# Patient Record
Sex: Female | Born: 1955 | Race: White | Hispanic: No | Marital: Married | Smoking: Never smoker
Health system: Southern US, Community
[De-identification: ages and names within clinical notes are randomized; demographics above are authoritative.]

## PROBLEM LIST (undated history)

## (undated) DIAGNOSIS — I499 Cardiac arrhythmia, unspecified: Secondary | ICD-10-CM

## (undated) DIAGNOSIS — E7800 Pure hypercholesterolemia, unspecified: Secondary | ICD-10-CM

## (undated) DIAGNOSIS — Z923 Personal history of irradiation: Secondary | ICD-10-CM

## (undated) DIAGNOSIS — N3281 Overactive bladder: Secondary | ICD-10-CM

## (undated) DIAGNOSIS — Z87898 Personal history of other specified conditions: Secondary | ICD-10-CM

## (undated) DIAGNOSIS — M1990 Unspecified osteoarthritis, unspecified site: Secondary | ICD-10-CM

## (undated) DIAGNOSIS — L719 Rosacea, unspecified: Secondary | ICD-10-CM

## (undated) DIAGNOSIS — C50919 Malignant neoplasm of unspecified site of unspecified female breast: Secondary | ICD-10-CM

## (undated) DIAGNOSIS — K219 Gastro-esophageal reflux disease without esophagitis: Secondary | ICD-10-CM

## (undated) DIAGNOSIS — K5190 Ulcerative colitis, unspecified, without complications: Secondary | ICD-10-CM

## (undated) DIAGNOSIS — C50912 Malignant neoplasm of unspecified site of left female breast: Secondary | ICD-10-CM

## (undated) DIAGNOSIS — I10 Essential (primary) hypertension: Secondary | ICD-10-CM

## (undated) HISTORY — PX: COLONOSCOPY: SHX174

## (undated) HISTORY — DX: Malignant neoplasm of unspecified site of left female breast: C50.912

## (undated) HISTORY — DX: Overactive bladder: N32.81

## (undated) HISTORY — PX: CRYOABLATION: SHX1415

## (undated) HISTORY — DX: Pure hypercholesterolemia, unspecified: E78.00

---

## 2004-03-27 ENCOUNTER — Ambulatory Visit: Payer: Self-pay | Admitting: Internal Medicine

## 2005-06-06 ENCOUNTER — Ambulatory Visit: Payer: Self-pay | Admitting: Internal Medicine

## 2005-06-19 ENCOUNTER — Ambulatory Visit: Payer: Self-pay | Admitting: Internal Medicine

## 2006-07-17 ENCOUNTER — Ambulatory Visit: Payer: Self-pay | Admitting: Internal Medicine

## 2007-07-18 ENCOUNTER — Ambulatory Visit: Payer: Self-pay | Admitting: Internal Medicine

## 2008-07-05 ENCOUNTER — Ambulatory Visit: Payer: Self-pay | Admitting: Unknown Physician Specialty

## 2008-08-04 ENCOUNTER — Ambulatory Visit: Payer: Self-pay | Admitting: Internal Medicine

## 2008-08-12 ENCOUNTER — Ambulatory Visit: Payer: Self-pay | Admitting: Internal Medicine

## 2009-02-16 ENCOUNTER — Ambulatory Visit: Payer: Self-pay | Admitting: Internal Medicine

## 2009-03-01 ENCOUNTER — Ambulatory Visit: Payer: Self-pay | Admitting: Internal Medicine

## 2010-03-28 ENCOUNTER — Ambulatory Visit: Payer: Self-pay | Admitting: Internal Medicine

## 2011-02-02 ENCOUNTER — Ambulatory Visit: Payer: Self-pay | Admitting: Unknown Physician Specialty

## 2011-03-30 ENCOUNTER — Ambulatory Visit: Payer: Self-pay | Admitting: Internal Medicine

## 2011-07-13 ENCOUNTER — Ambulatory Visit: Payer: Self-pay | Admitting: Internal Medicine

## 2012-03-05 DIAGNOSIS — C50919 Malignant neoplasm of unspecified site of unspecified female breast: Secondary | ICD-10-CM

## 2012-03-05 HISTORY — PX: BREAST LUMPECTOMY: SHX2

## 2012-03-05 HISTORY — PX: OOPHORECTOMY: SHX86

## 2012-03-05 HISTORY — PX: BREAST BIOPSY: SHX20

## 2012-03-05 HISTORY — DX: Malignant neoplasm of unspecified site of unspecified female breast: C50.919

## 2012-04-01 ENCOUNTER — Ambulatory Visit: Payer: Self-pay | Admitting: Internal Medicine

## 2012-04-04 ENCOUNTER — Ambulatory Visit: Payer: Self-pay | Admitting: Internal Medicine

## 2012-04-25 ENCOUNTER — Ambulatory Visit: Payer: Self-pay | Admitting: Surgery

## 2012-05-06 ENCOUNTER — Ambulatory Visit: Payer: Self-pay | Admitting: Surgery

## 2012-05-06 LAB — COMPREHENSIVE METABOLIC PANEL
Albumin: 3.3 g/dL — ABNORMAL LOW (ref 3.4–5.0)
Chloride: 109 mmol/L — ABNORMAL HIGH (ref 98–107)
Co2: 29 mmol/L (ref 21–32)
Creatinine: 0.73 mg/dL (ref 0.60–1.30)
EGFR (African American): >60
Glucose: 81 mg/dL (ref 65–99)
Osmolality: 284 (ref 275–301)
Potassium: 3.6 mmol/L (ref 3.5–5.1)
Sodium: 143 mmol/L (ref 136–145)
Total Protein: 7.1 g/dL (ref 6.4–8.2)

## 2012-05-06 LAB — CBC WITH DIFFERENTIAL/PLATELET
Eosinophil %: 2.2 %
HGB: 14.5 g/dL (ref 12.0–16.0)
Lymphocyte #: 1.5 10*3/uL (ref 1.0–3.6)
Lymphocyte %: 27.4 %
MCHC: 34.1 g/dL (ref 32.0–36.0)
Neutrophil #: 3.6 10*3/uL (ref 1.4–6.5)
RDW: 13.3 % (ref 11.5–14.5)
WBC: 5.5 10*3/uL (ref 3.6–11.0)

## 2012-05-06 LAB — PATHOLOGY REPORT

## 2012-05-12 ENCOUNTER — Ambulatory Visit: Payer: Self-pay | Admitting: Surgery

## 2012-05-13 LAB — PATHOLOGY REPORT

## 2012-05-19 ENCOUNTER — Ambulatory Visit: Payer: Self-pay | Admitting: Oncology

## 2012-06-03 ENCOUNTER — Ambulatory Visit: Payer: Self-pay | Admitting: Oncology

## 2012-06-25 LAB — CBC CANCER CENTER
Basophil %: 0.6 %
Eosinophil #: 0.1 x10 3/mm (ref 0.0–0.7)
HCT: 41.2 % (ref 35.0–47.0)
Lymphocyte #: 1.8 x10 3/mm (ref 1.0–3.6)
MCH: 31.2 pg (ref 26.0–34.0)
MCHC: 34.2 g/dL (ref 32.0–36.0)
MCV: 91 fL (ref 80–100)
Monocyte %: 5.1 %
Neutrophil #: 3.5 x10 3/mm (ref 1.4–6.5)
RDW: 13.3 % (ref 11.5–14.5)
WBC: 5.7 x10 3/mm (ref 3.6–11.0)

## 2012-07-02 LAB — CBC CANCER CENTER
Eosinophil #: 0.1 x10 3/mm (ref 0.0–0.7)
Eosinophil %: 1.7 %
HCT: 39.9 % (ref 35.0–47.0)
HGB: 13.9 g/dL (ref 12.0–16.0)
Lymphocyte #: 1.3 x10 3/mm (ref 1.0–3.6)
MCH: 31.4 pg (ref 26.0–34.0)
MCV: 90 fL (ref 80–100)
Monocyte #: 0.4 x10 3/mm (ref 0.2–0.9)
Neutrophil #: 4.2 x10 3/mm (ref 1.4–6.5)
Neutrophil %: 68.8 %
Platelet: 307 x10 3/mm (ref 150–440)
RBC: 4.43 10*6/uL (ref 3.80–5.20)

## 2012-07-03 ENCOUNTER — Ambulatory Visit: Payer: Self-pay | Admitting: Oncology

## 2012-07-09 LAB — CBC CANCER CENTER
Basophil #: 0 x10 3/mm (ref 0.0–0.1)
Basophil %: 0.7 %
Eosinophil #: 0.1 x10 3/mm (ref 0.0–0.7)
Eosinophil %: 2 %
HGB: 13.7 g/dL (ref 12.0–16.0)
Lymphocyte %: 18.4 %
MCH: 31.2 pg (ref 26.0–34.0)
MCHC: 34.2 g/dL (ref 32.0–36.0)
Monocyte #: 0.4 x10 3/mm (ref 0.2–0.9)
Neutrophil #: 4.6 x10 3/mm (ref 1.4–6.5)
Neutrophil %: 72 %
Platelet: 275 x10 3/mm (ref 150–440)
RBC: 4.38 10*6/uL (ref 3.80–5.20)
RDW: 13.2 % (ref 11.5–14.5)
WBC: 6.4 x10 3/mm (ref 3.6–11.0)

## 2012-07-23 LAB — CBC CANCER CENTER
HGB: 13.8 g/dL (ref 12.0–16.0)
Lymphocyte #: 0.8 x10 3/mm — ABNORMAL LOW (ref 1.0–3.6)
MCH: 31.7 pg (ref 26.0–34.0)
MCHC: 34.5 g/dL (ref 32.0–36.0)
MCV: 92 fL (ref 80–100)
Monocyte #: 0.5 x10 3/mm (ref 0.2–0.9)
Monocyte %: 7.8 %
Neutrophil #: 4.4 x10 3/mm (ref 1.4–6.5)
Neutrophil %: 73.6 %
Platelet: 244 x10 3/mm (ref 150–440)
WBC: 6 x10 3/mm (ref 3.6–11.0)

## 2012-07-30 LAB — CBC CANCER CENTER
Basophil #: 0 x10 3/mm (ref 0.0–0.1)
Eosinophil %: 1.3 %
HGB: 13.8 g/dL (ref 12.0–16.0)
Lymphocyte #: 0.6 x10 3/mm — ABNORMAL LOW (ref 1.0–3.6)
Lymphocyte %: 6.6 %
Monocyte #: 0.4 x10 3/mm (ref 0.2–0.9)
Monocyte %: 4.6 %
Neutrophil #: 7.6 x10 3/mm — ABNORMAL HIGH (ref 1.4–6.5)
Neutrophil %: 87.1 %
RBC: 4.46 10*6/uL (ref 3.80–5.20)
RDW: 13.7 % (ref 11.5–14.5)
WBC: 8.7 x10 3/mm (ref 3.6–11.0)

## 2012-08-03 ENCOUNTER — Ambulatory Visit: Payer: Self-pay | Admitting: Oncology

## 2012-09-02 ENCOUNTER — Ambulatory Visit: Payer: Self-pay | Admitting: Oncology

## 2012-10-03 ENCOUNTER — Ambulatory Visit: Payer: Self-pay | Admitting: Oncology

## 2012-11-28 ENCOUNTER — Ambulatory Visit: Payer: Self-pay | Admitting: Oncology

## 2012-12-03 ENCOUNTER — Ambulatory Visit: Payer: Self-pay | Admitting: Oncology

## 2013-01-05 ENCOUNTER — Ambulatory Visit: Payer: Self-pay | Admitting: Obstetrics and Gynecology

## 2013-01-05 LAB — COMPREHENSIVE METABOLIC PANEL
Albumin: 3.4 g/dL (ref 3.4–5.0)
Alkaline Phosphatase: 87 U/L (ref 50–136)
Anion Gap: 0 — ABNORMAL LOW (ref 7–16)
Co2: 32 mmol/L (ref 21–32)
EGFR (Non-African Amer.): >60
Glucose: 94 mg/dL (ref 65–99)
Osmolality: 279 (ref 275–301)
SGPT (ALT): 34 U/L (ref 12–78)
Sodium: 140 mmol/L (ref 136–145)
Total Protein: 6.7 g/dL (ref 6.4–8.2)

## 2013-01-05 LAB — CBC
HGB: 14 g/dL (ref 12.0–16.0)
MCH: 32 pg (ref 26.0–34.0)
MCHC: 35.4 g/dL (ref 32.0–36.0)
MCV: 90 fL (ref 80–100)
RBC: 4.37 10*6/uL (ref 3.80–5.20)
RDW: 13.1 % (ref 11.5–14.5)
WBC: 5.4 10*3/uL (ref 3.6–11.0)

## 2013-01-16 ENCOUNTER — Ambulatory Visit: Payer: Self-pay | Admitting: Obstetrics and Gynecology

## 2013-02-10 ENCOUNTER — Ambulatory Visit: Payer: Self-pay | Admitting: Oncology

## 2013-02-10 LAB — COMPREHENSIVE METABOLIC PANEL
Alkaline Phosphatase: 75 U/L
Anion Gap: 8 (ref 7–16)
Bilirubin,Total: 0.3 mg/dL (ref 0.2–1.0)
Calcium, Total: 8.8 mg/dL (ref 8.5–10.1)
Chloride: 104 mmol/L (ref 98–107)
Co2: 30 mmol/L (ref 21–32)
EGFR (African American): >60
Osmolality: 284 (ref 275–301)
Potassium: 3.3 mmol/L — ABNORMAL LOW (ref 3.5–5.1)
SGOT(AST): 17 U/L (ref 15–37)
Total Protein: 6.9 g/dL (ref 6.4–8.2)

## 2013-02-10 LAB — CBC CANCER CENTER
Eosinophil #: 0.2 x10 3/mm (ref 0.0–0.7)
Eosinophil %: 3.6 %
HCT: 41.1 % (ref 35.0–47.0)
Lymphocyte #: 1.4 x10 3/mm (ref 1.0–3.6)
MCHC: 33.4 g/dL (ref 32.0–36.0)
Monocyte %: 7.1 %
Neutrophil %: 64.3 %
Platelet: 265 x10 3/mm (ref 150–440)
RBC: 4.47 10*6/uL (ref 3.80–5.20)
RDW: 13.6 % (ref 11.5–14.5)
WBC: 6 x10 3/mm (ref 3.6–11.0)

## 2013-02-11 LAB — CANCER ANTIGEN 27.29: CA 27.29: 35.2 U/mL (ref 0.0–38.6)

## 2013-03-05 ENCOUNTER — Ambulatory Visit: Payer: Self-pay | Admitting: Oncology

## 2013-04-07 ENCOUNTER — Ambulatory Visit: Payer: Self-pay | Admitting: Oncology

## 2013-08-14 ENCOUNTER — Ambulatory Visit: Payer: Self-pay | Admitting: Oncology

## 2013-08-17 LAB — COMPREHENSIVE METABOLIC PANEL
ALK PHOS: 94 U/L
ANION GAP: 6 — AB (ref 7–16)
Albumin: 3.4 g/dL (ref 3.4–5.0)
BILIRUBIN TOTAL: 0.4 mg/dL (ref 0.2–1.0)
BUN: 15 mg/dL (ref 7–18)
CO2: 29 mmol/L (ref 21–32)
CREATININE: 0.59 mg/dL — AB (ref 0.60–1.30)
Calcium, Total: 8.7 mg/dL (ref 8.5–10.1)
Chloride: 107 mmol/L (ref 98–107)
EGFR (African American): >60
EGFR (Non-African Amer.): >60
GLUCOSE: 113 mg/dL — AB (ref 65–99)
OSMOLALITY: 285 (ref 275–301)
POTASSIUM: 3.5 mmol/L (ref 3.5–5.1)
SGOT(AST): 16 U/L (ref 15–37)
SGPT (ALT): 42 U/L (ref 12–78)
Sodium: 142 mmol/L (ref 136–145)
Total Protein: 6.9 g/dL (ref 6.4–8.2)

## 2013-08-17 LAB — CBC CANCER CENTER
BASOS ABS: 0.1 x10 3/mm (ref 0.0–0.1)
BASOS PCT: 1.3 %
EOS ABS: 0.3 x10 3/mm (ref 0.0–0.7)
Eosinophil %: 4.6 %
HCT: 40.8 % (ref 35.0–47.0)
HGB: 13.8 g/dL (ref 12.0–16.0)
LYMPHS ABS: 1.3 x10 3/mm (ref 1.0–3.6)
LYMPHS PCT: 23.5 %
MCH: 31.6 pg (ref 26.0–34.0)
MCHC: 33.8 g/dL (ref 32.0–36.0)
MCV: 93 fL (ref 80–100)
Monocyte #: 0.3 x10 3/mm (ref 0.2–0.9)
Monocyte %: 6 %
NEUTROS ABS: 3.7 x10 3/mm (ref 1.4–6.5)
Neutrophil %: 64.6 %
Platelet: 257 x10 3/mm (ref 150–440)
RBC: 4.37 10*6/uL (ref 3.80–5.20)
RDW: 13.4 % (ref 11.5–14.5)
WBC: 5.7 x10 3/mm (ref 3.6–11.0)

## 2013-09-02 ENCOUNTER — Ambulatory Visit: Payer: Self-pay | Admitting: Oncology

## 2013-11-23 DIAGNOSIS — E785 Hyperlipidemia, unspecified: Secondary | ICD-10-CM | POA: Insufficient documentation

## 2014-02-20 IMAGING — NM NM SENTINAL NODE INJECTION (BREAST) - NO REPORT
2 series · 18 of 18 positions shown · non-contrast
Comparison: none

REASON FOR EXAM: L partial mastectomy SN bx surg 11am   XR NL poss
axillary dissection   2721...
COMMENTS:

PROCEDURE:     NM  - NM SENTINEL NODE  BREAST  - May 12, 2012  [DATE]
RESULT:     Comparison: None.
Radiopharmaceutical:  1.08 mCi Kc-MMm unfiltered sulfur colloid.
TECHNIQUE: After the skin was cleansed in the usual sterile technique, and
local anesthesia was provided with approximately 2 mL 1% Lidocaine, the
radiopharmaceutical was injected into the subcutaneous periareolar tissues
of the left breast. Planar images were obtained in the anterior projection.
The patient's arm was abducted at 90 degrees from the body for the anterior
images.

[Series 1000: sent node breast static · 2.40mm/px · 6 acquisitions, 12 frames shown]
[im 1/6]
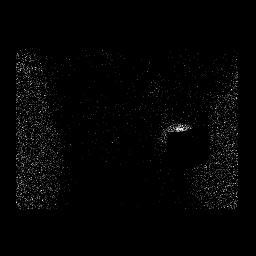
[im 1/6]
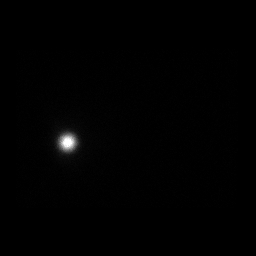
[im 2/6]
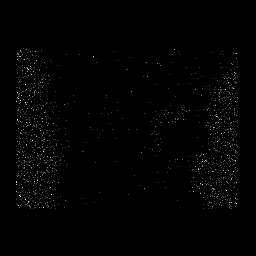
[im 2/6]
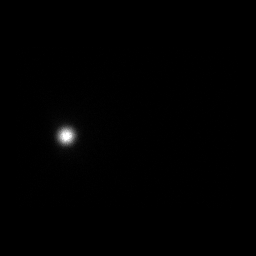
[im 3/6]
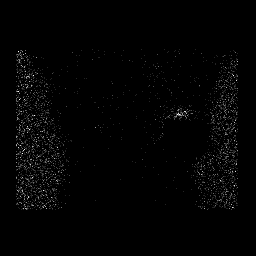
[im 3/6]
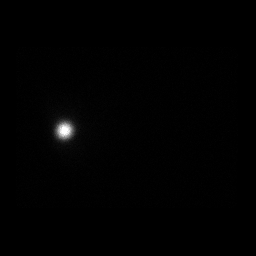
[im 4/6]
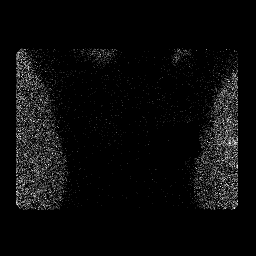
[im 4/6]
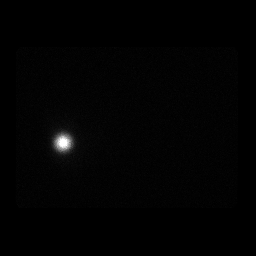
[im 5/6]
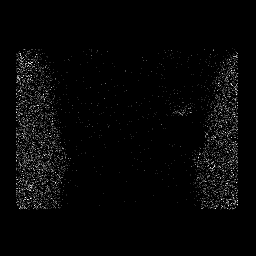
[im 5/6]
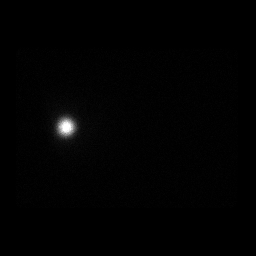
[im 6/6]
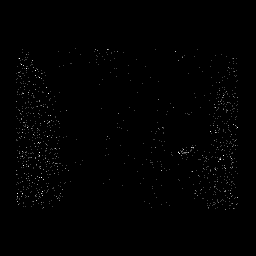
[im 6/6]
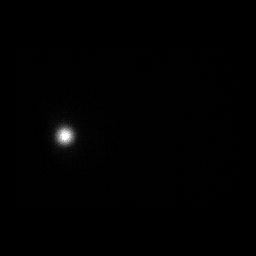

[Series 1000: sent node breast-dynamic · 4.80mm/px · 6 of 52 frames shown]
[frame 5/52  full-range]
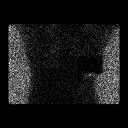
[frame 13/52  full-range]
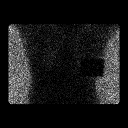
[frame 22/52  full-range]
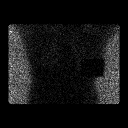
[frame 31/52  full-range]
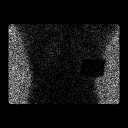
[frame 39/52  full-range]
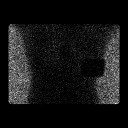
[frame 48/52  full-range]
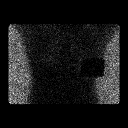

[18 of 18 positions shown; findings below may reference images not displayed]

FINDINGS: Images obtained at 90 minutes post injection demonstrate localization of
radiotracer in the region of the left periareolar soft tissues..
IMPRESSION: Successful injection of radiotracer into the left breast
periareolar soft tissues for intraoperative localization of sentinel lymph
nodes with a gamma probe.

## 2014-02-22 ENCOUNTER — Ambulatory Visit: Payer: Self-pay | Admitting: Oncology

## 2014-02-22 LAB — COMPREHENSIVE METABOLIC PANEL
ALBUMIN: 3.5 g/dL (ref 3.4–5.0)
Alkaline Phosphatase: 111 U/L
Anion Gap: 7 (ref 7–16)
BUN: 17 mg/dL (ref 7–18)
Bilirubin,Total: 0.4 mg/dL (ref 0.2–1.0)
CREATININE: 0.76 mg/dL (ref 0.60–1.30)
Calcium, Total: 8.5 mg/dL (ref 8.5–10.1)
Chloride: 106 mmol/L (ref 98–107)
Co2: 31 mmol/L (ref 21–32)
EGFR (African American): >60
EGFR (Non-African Amer.): >60
Glucose: 80 mg/dL (ref 65–99)
Osmolality: 287 (ref 275–301)
Potassium: 3.6 mmol/L (ref 3.5–5.1)
SGOT(AST): 16 U/L (ref 15–37)
SGPT (ALT): 37 U/L
SODIUM: 144 mmol/L (ref 136–145)
TOTAL PROTEIN: 7 g/dL (ref 6.4–8.2)

## 2014-02-22 LAB — CBC CANCER CENTER
BASOS ABS: 0.1 x10 3/mm (ref 0.0–0.1)
Basophil %: 0.9 %
EOS PCT: 2.4 %
Eosinophil #: 0.1 x10 3/mm (ref 0.0–0.7)
HCT: 42.7 % (ref 35.0–47.0)
HGB: 14.3 g/dL (ref 12.0–16.0)
Lymphocyte #: 1.3 x10 3/mm (ref 1.0–3.6)
Lymphocyte %: 20.9 %
MCH: 30.9 pg (ref 26.0–34.0)
MCHC: 33.5 g/dL (ref 32.0–36.0)
MCV: 92 fL (ref 80–100)
Monocyte #: 0.5 x10 3/mm (ref 0.2–0.9)
Monocyte %: 7.3 %
Neutrophil #: 4.3 x10 3/mm (ref 1.4–6.5)
Neutrophil %: 68.5 %
Platelet: 280 x10 3/mm (ref 150–440)
RBC: 4.63 10*6/uL (ref 3.80–5.20)
RDW: 13.3 % (ref 11.5–14.5)
WBC: 6.3 x10 3/mm (ref 3.6–11.0)

## 2014-03-05 ENCOUNTER — Ambulatory Visit: Payer: Self-pay | Admitting: Oncology

## 2014-04-08 ENCOUNTER — Ambulatory Visit: Payer: Self-pay | Admitting: Oncology

## 2014-04-29 ENCOUNTER — Ambulatory Visit: Payer: Self-pay | Admitting: Oncology

## 2014-06-25 NOTE — Op Note (Signed)
PATIENT NAME:  Debbie Floyd MR#:  [id number] DATE OF BIRTH:  1956-01-30  DATE OF PROCEDURE:  05/12/2012  PREOPERATIVE DIAGNOSIS: Carcinoma of the left breast.   POSTOPERATIVE DIAGNOSIS: Carcinoma of the left breast.   PROCEDURE: Left partial mastectomy with axillary sentinel lymph node biopsy.   SURGEON: Rochel Brome, M.D.   ANESTHESIA: General.   INDICATIONS: This 59 year old female recently had a mammogram depicting a mass in the upper outer quadrant of the left breast. She had needle biopsy demonstrating infiltrating mammary carcinoma. She had preoperative x-ray needle localization and sentinel node scan.   DESCRIPTION OF PROCEDURE:  The patient was placed on the operating table in the supine position under general anesthesia. The dressing was removed from the left breast, exposing the Kopans wire which entered the breast at approximately the 3 o'clock position. The wire was cut 2 cm from the skin. The left arm was placed on a lateral arm support. The left breast, axilla and surrounding areas were prepared with ChloraPrep and draped in a sterile manner.   The sentinel lymph node biopsy was done first. The axilla was scanned with the gamma counter, demonstrating location of radioactivity in the inferior aspect of the left axilla. An oblique incision was made from the border of the pectoralis major muscle posteriorly some 4 cm in length, carried down through subcutaneous tissues. Several small bleeding points were cauterized. Dissection was carried down through the superficial fascia using the gamma counter for direction and dissected down deeply within the axilla adjacent to the rib cage to encounter a lymph node with radioactivity. This lymph node was somewhat large in size but soft and was dissected free from surrounding structures and did remove a small amount of fatty material with it. One bleeding point was suture ligated with 4-0 chromic. The lymph node was excised. It was approximately a  centimeter in dimension. The ex vivo count was in the range of 190 to 220  counts per second. This was sent for frozen section. The background count was less than 11. There was no remaining palpable mass within the axilla. Hemostasis appeared to be intact.   Attention was turned to do the left partial mastectomy.  A curvilinear incision was made from the 2 o'clock to the 4 o'clock position in the outer aspect left breast and did remove an ellipse of skin which was approximately 1 cm in width and dissected down laterally to encounter the wire and dissected medially and dissected around the wire, and began to feel some firmness surrounding the innermost portion of the wire. The tissues were dissected free from surrounding structures and dissected deeply within the breast and removed the specimen. The 4 o'clock position of the skin ellipse was tagged with a stitch. Margin maps were attached to the specimen to mark the medial, lateral, cranial, caudal and deep margins and was submitted for pathology. Both wounds were inspected and hemostasis appeared to be intact. Both wounds were infiltrated with 0.5% Sensorcaine with epinephrine.   The pathologist called back 2 times to first report the sentinel lymph node appeared to be free of tumor and second time to indicate that the surgical margins appeared to be free of tumor. The axillary wound was closed with a running 4-0 Monocryl subcuticular suture. The breast wound was closed with 4-0 chromic interrupted subcutaneous sutures and then the skin was closed with running 4-0 Monocryl subcuticular suture. Both wounds were then treated with Dermabond and allowed to dry. The patient appeared to tolerate procedure satisfactorily  and was then prepared for transfer to the recovery room.    ____________________________ Lenna Sciara. Rochel Brome, MD jws:cs D: 05/12/2012 13:13:34 ET T: 05/12/2012 14:23:37 ET JOB#: [id number]  cc: Loreli Dollar, MD, <Dictator> Loreli Dollar  MD ELECTRONICALLY SIGNED 05/13/2012 19:05

## 2014-06-25 NOTE — Consult Note (Signed)
Reason for Visit: This 59 year old Female patient presents to the clinic for initial evaluation of  breast cancer .   Referred by Dr. Oliva Bustard.  Diagnosis:  Chief Complaint/Diagnosis   59 year old female with pathologic stage Ib (T1 B. N0 M0) invasive mammary carcinoma status post wide local excision and sentinel node biopsy ER/PR positive HER-2/neu negative by fish  Pathology Report pathology report reviewed   Imaging Report mammograms ultrasound reviewed   Referral Report clinical notes reviewed   Planned Treatment Regimen adjuvant whole breast radiation   HPI   patient is a10 year old femalewho presented with an abnormal mammogram of her left breast. there was a nodular density noted in the 2:00 region anteriorly concerning for malignancy. This was confirmed on ultrasound.she underwent biopsy positive for invasive mammary carcinoma. She then underwent a wide local excision and sentinel node biopsy for a 0.9 cm invasive mammary carcinoma. Margins were clear at 3 mm. There was ductal carcinomas also involved margins clear for that also. Sentinel lymph node was negative for metastatic disease. Tumor was overall grade 2. Tumor was ER/PR positive HER-2/neu not overexpressed.patients been seen by medical oncology and Oncotype DX has been ordered. She seen today for opinion regarding radiation therapy. She is doing well. She specifically denies breast tenderness cough or bone pain. She did have some bruising around this the site although that is responding well.  Past Hx:    Breast Cancer, Left:    Ulcerative Colitis:    Lumpectomy - left:   Past, Family and Social History:  Past Medical History positive   Gastrointestinal ulcerative colitis   Family History positive   Family History Comments maternal grandmother with ovarian cancer, father side effects or has significant breast cancer history. Also history of lymphoma and prostate cancer   Social History noncontributory    Additional Past Medical and Surgical History accompanied by husband today   Allergies:   Sulfa: Fever, Rash  Penicillin: Rash  Home Meds:  Home Medications: Medication Instructions Status  multivitamin 1 tab(s) orally once a day (in the evening) Active  Delzicol 400 mg oral delayed release capsule 1 cap(s) orally 2 times a day Active  doxycycline hyclate 100 mg oral capsule 1 cap(s) orally every other day Active  omeprazole 20 mg oral delayed release capsule 1 cap(s) orally once a day (in the morning) Active  oxybutynin 5 mg/24 hours oral tablet, extended release 1 tab(s) orally once a day (at bedtime) Active  Metoprolol Tartrate 25 mg oral tablet 1 tab(s) orally once a day (at bedtime) Active  ibuprofen 200 mg oral capsule 2 cap(s) orally once a day, As Needed. Stopped 3 weeks ago Active   Review of Systems:  General negative   Performance Status (ECOG) 0   Skin negative   Breast see HPI   Ophthalmologic negative   ENMT negative   Respiratory and Thorax negative   Cardiovascular negative   Gastrointestinal negative   Genitourinary negative   Musculoskeletal negative   Neurological negative   Psychiatric negative   Hematology/Lymphatics negative   Endocrine negative   Allergic/Immunologic negative   Review of Systems   according to the nurse's notesPatient denies any weight loss, fatigue, weakness, fever, chills or night sweats. Patient denies any loss of vision, blurred vision. Patient denies any ringing  of the ears or hearing loss. No irregular heartbeat. Patient denies heart murmur or history of fainting. Patient denies any chest pain or pain radiating to her upper extremities. Patient denies any shortness of breath, difficulty  breathing at night, cough or hemoptysis. Patient denies any swelling in the lower legs. Patient denies any nausea vomiting, vomiting of blood, or coffee ground material in the vomitus. Patient denies any stomach pain. Patient states has  had normal bowel movements no significant constipation or diarrhea. Patient denies any dysuria, hematuria or significant nocturia. Patient denies any problems walking, swelling in the joints or loss of balance. Patient denies any skin changes, loss of hair or loss of weight. Patient denies any excessive worrying or anxiety or significant depression. Patient denies any problems with insomnia. Patient denies excessive thirst, polyuria, polydipsia. Patient denies any swollen glands, patient denies easy bruising or easy bleeding. Patient denies any recent infections, allergies or URI. Patient "s visual fields have not changed significantly in recent time.  Nursing Notes:  Nursing Vital Signs and Chemo Nursing Nursing Notes: *CC Vital Signs Flowsheet:   20-Mar-14 14:15  Temp Temperature 98.9  Pulse Pulse 84  Respirations Respirations 20  SBP SBP 152  DBP DBP 80  Pain Scale (0-10)  0  Current Weight (kg) (kg) 76.3  Height (cm) centimeters 165  BSA (m2) 1.8   Physical Exam:  General/Skin/HEENT:  General normal   Skin normal   Eyes normal   ENMT normal   Head and Neck normal   Additional PE well-developed well-nourished female in NAD. Lungs are clear to A&P cardiac examination shows regular rate and rhythm. Left breast shows a wide local excision scar which is healing well with some still persistent ecchymosis surrounding the scar site. No dominant mass or nodularity in either breast into position examined. No axillary or supraclavicular adenopathy is appreciated.   Breasts/Resp/CV/GI/GU:  Respiratory and Thorax normal   Cardiovascular normal   Gastrointestinal normal   Genitourinary normal   MS/Neuro/Psych/Lymph:  Musculoskeletal normal   Neurological normal   Lymphatics normal   Other Results:  Radiology Results: [place]:    31-Jan-14 09:30, US Breast Left  US Breast Left   REASON FOR EXAM:    av lt parenchymal density  COMMENTS:       PROCEDURE: [place]  - US BREAST LEFT  -  Apr 04 2012  9:30AM     RESULT: Targeted ultrasound left breast 2:00 region demonstrates an   irregularly marginated area of hypoechogenicity with shadowing concerning   for malignancy. This measures 8.5 x 6.1 x 7.3 mm. A this is 2 cm from the   nipple. There is evidence of internal blood flow.    IMPRESSION:  Findings concerning for underlying malignancy in the left   breast 2:00 region 3 cm from the nipple. Surgical consultation for   lumpectomy is suggested. A BI-RADS category 4    Dictation Site: 1    Verified By: Sundra Aland, M.D., MD  LabUnknown:    31-Jan-14 08:52, Digital Additional Views Lt Breast [facility])  PACS Image     31-Jan-14 09:30, US Breast Left  PACS [place]:    31-Jan-14 08:52, Digital Additional Views Lt Breast (SCR)  Digital Additional Views Lt Breast (SCR)   REASON FOR EXAM:    av lt parenchymal density  COMMENTS:       PROCEDURE: MAM - MAM DGTL ADD VW LT  SCR  - Apr 04 2012  8:52AM     RESULT: The patient return for additional magnification compression   images of the left breast area of irregular parenchymal density which is   increased in size compared to previous exams that had been previously  stable. The area persists on the additional magnification compression   images. Ultrasound is dictated separately. The margins appear to be   irregular. There is some architectural distortion. The lesion is   concerning for left breast cancer in approximately the 2:00 position.    IMPRESSION:  And nodular area in the left breast 2:00 region anteriorly   concerning for underlying malignancy. Surgical consultation with biopsy     is recommended.    BI-RADS: Category 4 - Suspicious Abnormality - Biopsy Should Be Considered      Thank you for the oppurtunity to contribute to the care of your patient.     Dictation Site: 1        Verified By: Sundra Aland, M.D., MD   Assessment and Plan: Impression:   stage IB invasive mammary  carcinoma left breast teslas wide local excision in 59 year old female tumor is ER/PR positive HER-2/neu not overexpressed patient is pending Oncotype DX testing Plan:   patient has excellent prognostic factors although we will wait Oncotype DX result before proceeding with radiation therapy. Should she be a high-risk of recurrence will wait for radiation therapy to left chemotherapy is completed. Otherwise we'll go ahead with whole breast radiation therapy to 5000 cGy and boost or scar another 1600 cGy using electron beam. Risks and benefits of treatment including skin reaction, fatigue, inclusion of some superficial lung, and alteration blood counts were all explained in detail to the patient and her husband. They both seem to comprehend my treatment plan well. I have set her up for CT simulation about 2 weeks we'll not proceed until I have Oncotype DX result and have discussed them personally with Dr. Oliva Bustard.  I would like to take this opportunity to thank you for allowing me to continue to participate in this patient's care.  CC Referral:  cc: Dr. Tamala Julian, Dr. Apolonio Schneiders   Electronic Signatures: Baruch Gouty, Roda Shutters (MD)  (Signed 20-Mar-14 15:16)  Authored: HPI, Diagnosis, Past Hx, PFSH, Allergies, Home Meds, ROS, Nursing Notes, Physical Exam, Other Results, Encounter Assessment and Plan, CC Referring Physician   Last Updated: 20-Mar-14 15:16 by Armstead Peaks (MD)

## 2014-06-25 NOTE — Op Note (Signed)
PATIENT NAME:  Debbie Floyd, Debbie Floyd MR#:  [id number] DATE OF BIRTH:  [birth year]  DATE OF PROCEDURE:  01/16/2013  PREOPERATIVE DIAGNOSIS: Estrogen receptor positive breast cancer.   POSTOPERATIVE DIAGNOSIS: Estrogen receptor positive breast cancer.   PROCEDURES:  1. Operative laparoscopy.  2. Bilateral salpingo-oophorectomy.   ANESTHESIA: General.   SURGEON: Will Bonnet, M.D.   ASSISTANT SURGEON: Annia Belt, P.A.S. (student).   ESTIMATED BLOOD LOSS: 10 mL.   OPERATIVE FLUIDS: 1100 mL of crystalloid.   COMPLICATIONS: None.   FINDINGS: Normal-appearing uterus, fallopian tubes and ovaries.   SPECIMENS:  1. Right fallopian tube and ovary.  2. Left fallopian tube and ovary.   CONDITION AT END OF THE PROCEDURE: Stable.   PROCEDURE IN DETAIL: The patient was taken to the operating room where general anesthesia was administered and found to be adequate. She was placed in the dorsal supine position and prepped and draped in the usual sterile fashion. After a timeout was called and injection of local anesthetic, a 10 mm infraumbilical skin incision was made with a scalpel. The abdomen was entered using direct visualization with an Optiview trocar technique. Abdominal entry was verified using opening pressure. The abdomen was insufflated with CO2. The camera was introduced through the operative port, and atraumatic entry was verified. The patient was placed in Trendelenburg, and a left lower quadrant 5 mm port was placed under direct intra-abdominal camera visualization after injection of local anesthetic. A right lower quadrant 5 mm port was placed in the same fashion without difficulty. Attention was turned to the right pelvic brim where the right fallopian tube and ovary were noted and separate from that where it was noted the ureter was well away from the operative area of interest. The right fallopian tube at the fimbriated end was grasped with an atraumatic grasper, and the IP ligament  was cauterized using the LigaSure device and then transected with the LigaSure device as well. The entire fallopian tube and ovary were then removed after transection of the broad ligament as well as the utero-ovarian ligament. Hemostasis was obtained, and the specimen was removed through the umbilical port using an Endo Catch bag.   Attention was turned to the left fallopian tube and ovary, and in a similar fashion, the ureter was identified to be well out of the way of the operative area of interest. Using the LigaSure device, the IP ligament was cauterized with electrocautery and transected, and the left fallopian tube and ovary were removed in the usual fashion. Hemostasis was noted at the entire pedicle for both right and left fallopian tubes and ovaries.   This concluded this procedure. The abdomen was desufflated, and all port sites were removed without difficulty. The 10 mm infraumbilical port site was closed subcutaneously using 3-0 Vicryl, and the skin was approximated at each port site using Dermabond. A total of 20 mL was injected for local anesthetic at all sites. The anesthetic was 0.5% bupivacaine.   The patient tolerated the procedure well. Sponge, lap and needle counts were correct x 2. The patient did have an indwelling catheter in place throughout the entire case but was removed at the end of the procedure. She was using pneumatic compression stockings for VTE prophylaxis throughout the entire case. She was awakened in the operating room and taken to the recovery area in stable condition.    ____________________________ Will Bonnet, MD sdj:gb D: 01/16/2013 16:34:02 ET T: 01/16/2013 22:34:22 ET JOB#: [id number]  cc: Will Bonnet, MD, <Dictator> Jaivion Kingsley D  Glennon Mac MD ELECTRONICALLY SIGNED 01/18/2013 12:14

## 2014-08-23 ENCOUNTER — Inpatient Hospital Stay: Payer: Self-pay

## 2014-08-23 ENCOUNTER — Ambulatory Visit: Payer: Self-pay | Admitting: Oncology

## 2014-08-24 ENCOUNTER — Other Ambulatory Visit: Payer: Self-pay

## 2014-08-24 ENCOUNTER — Ambulatory Visit: Payer: Self-pay | Admitting: Oncology

## 2014-09-07 ENCOUNTER — Other Ambulatory Visit: Payer: Self-pay

## 2014-09-07 ENCOUNTER — Ambulatory Visit: Payer: Self-pay | Admitting: Oncology

## 2014-09-10 ENCOUNTER — Other Ambulatory Visit: Payer: Self-pay | Admitting: *Deleted

## 2014-09-10 DIAGNOSIS — C50919 Malignant neoplasm of unspecified site of unspecified female breast: Secondary | ICD-10-CM

## 2014-09-14 ENCOUNTER — Encounter: Payer: Self-pay | Admitting: Oncology

## 2014-09-14 ENCOUNTER — Inpatient Hospital Stay: Payer: Federal, State, Local not specified - PPO

## 2014-09-14 ENCOUNTER — Inpatient Hospital Stay: Payer: Federal, State, Local not specified - PPO | Attending: Oncology | Admitting: Oncology

## 2014-09-14 VITALS — BP 141/68 | HR 67 | Temp 98.0°F | Wt 165.6 lb

## 2014-09-14 DIAGNOSIS — C50912 Malignant neoplasm of unspecified site of left female breast: Secondary | ICD-10-CM

## 2014-09-14 DIAGNOSIS — L719 Rosacea, unspecified: Secondary | ICD-10-CM | POA: Insufficient documentation

## 2014-09-14 DIAGNOSIS — K219 Gastro-esophageal reflux disease without esophagitis: Secondary | ICD-10-CM | POA: Insufficient documentation

## 2014-09-14 DIAGNOSIS — M25561 Pain in right knee: Secondary | ICD-10-CM | POA: Insufficient documentation

## 2014-09-14 DIAGNOSIS — Z923 Personal history of irradiation: Secondary | ICD-10-CM | POA: Insufficient documentation

## 2014-09-14 DIAGNOSIS — C50919 Malignant neoplasm of unspecified site of unspecified female breast: Secondary | ICD-10-CM

## 2014-09-14 DIAGNOSIS — Z853 Personal history of malignant neoplasm of breast: Secondary | ICD-10-CM | POA: Insufficient documentation

## 2014-09-14 DIAGNOSIS — C50412 Malignant neoplasm of upper-outer quadrant of left female breast: Secondary | ICD-10-CM | POA: Insufficient documentation

## 2014-09-14 DIAGNOSIS — Z90722 Acquired absence of ovaries, bilateral: Secondary | ICD-10-CM | POA: Diagnosis not present

## 2014-09-14 DIAGNOSIS — Z79899 Other long term (current) drug therapy: Secondary | ICD-10-CM | POA: Diagnosis not present

## 2014-09-14 DIAGNOSIS — K5190 Ulcerative colitis, unspecified, without complications: Secondary | ICD-10-CM | POA: Insufficient documentation

## 2014-09-14 DIAGNOSIS — Z170 Estrogen receptor positive status [ER+]: Secondary | ICD-10-CM | POA: Diagnosis not present

## 2014-09-14 DIAGNOSIS — Z87898 Personal history of other specified conditions: Secondary | ICD-10-CM | POA: Insufficient documentation

## 2014-09-14 HISTORY — DX: Malignant neoplasm of unspecified site of left female breast: C50.912

## 2014-09-14 LAB — CBC WITH DIFFERENTIAL/PLATELET
Basophils Absolute: 0 10*3/uL (ref 0–0.1)
Basophils Relative: 1 %
EOS ABS: 0.2 10*3/uL (ref 0–0.7)
EOS PCT: 3 %
HCT: 41.3 % (ref 35.0–47.0)
HEMOGLOBIN: 13.6 g/dL (ref 12.0–16.0)
Lymphocytes Relative: 27 %
Lymphs Abs: 1.6 10*3/uL (ref 1.0–3.6)
MCH: 30.8 pg (ref 26.0–34.0)
MCHC: 33 g/dL (ref 32.0–36.0)
MCV: 93.1 fL (ref 80.0–100.0)
MONO ABS: 0.3 10*3/uL (ref 0.2–0.9)
Monocytes Relative: 6 %
NEUTROS PCT: 63 %
Neutro Abs: 3.6 10*3/uL (ref 1.4–6.5)
Platelets: 264 10*3/uL (ref 150–440)
RBC: 4.43 MIL/uL (ref 3.80–5.20)
RDW: 13.5 % (ref 11.5–14.5)
WBC: 5.8 10*3/uL (ref 3.6–11.0)

## 2014-09-14 LAB — COMPREHENSIVE METABOLIC PANEL
ALK PHOS: 87 U/L (ref 38–126)
ALT: 26 U/L (ref 14–54)
ANION GAP: 4 — AB (ref 5–15)
AST: 18 U/L (ref 15–41)
Albumin: 3.8 g/dL (ref 3.5–5.0)
BUN: 13 mg/dL (ref 6–20)
CALCIUM: 8.4 mg/dL — AB (ref 8.9–10.3)
CO2: 28 mmol/L (ref 22–32)
Chloride: 103 mmol/L (ref 101–111)
Creatinine, Ser: 0.62 mg/dL (ref 0.44–1.00)
GFR calc non Af Amer: >60 mL/min (ref >60)
Glucose, Bld: 95 mg/dL (ref 65–99)
POTASSIUM: 3.9 mmol/L (ref 3.5–5.1)
Sodium: 135 mmol/L (ref 135–145)
Total Bilirubin: 0.7 mg/dL (ref 0.3–1.2)
Total Protein: 6.9 g/dL (ref 6.5–8.1)

## 2014-09-14 NOTE — Progress Notes (Signed)
[place] @ [facility] [phone]  Fax:([phone]) [place]: [birth year]  MR#: [id number]  XKG#:[id number]  Patient Care Team: Derinda Late, MD as PCP - General (Family Medicine)  CHIEF COMPLAINT:  Chief Complaint  Patient presents with  . Follow-up    Oncology History   59 year old female with pathologic stage Ib (T1 B. N0 M0) invasive mammary carcinoma status post wide local excision and sentinel node biopsy ER/PR positive HER-2/neu negative by fish 2,Oncotype Dx  score is 6% (low) 3.  Finished radiation therapy may 2014 4,  Femara of  June of 2014 5.patient had bilateral oophorectomy inNovember of 2014     Cancer of left breast    No flowsheet data found.  INTERVAL HISTORY:  59year-old lady came today further follow-up regarding carcinoma of breast. Had a mammogram in February 4 and reported to be BI-RADS 2.  Had a bone density study which did not reveal any osteopenia or osteoporosis.  Patient is taking from at our tolerating well.  Had pain in the right knee REVIEW OF SYSTEMS:    general status: Patient is feeling  Well  No change in a performance status.  No chills.  No fever. HEEN  No evidence of stomatitis Lungs: No cough or shortness of breath Cardiac: No chest pain or paroxysmal nocturnal dyspnea GI: No nausea no vomiting no diarrhea no abdominal pain Skin: No rash Lower extremity no swelling Neurological system: No tingling.  No numbness.  No other focal signs Musculoskeletal system no bony pains except for intermittent left knee pain  As per HPI. Otherwise, a complete review of systems is negatve.  PAST MEDICAL HISTORY: Past Medical History  Diagnosis Date  . Cancer of left breast 09/14/2014   Significant History/PMH:   Breast Cancer, Left:    Ulcerative Colitis:    Oopherectomy:    Lumpectomy - left:   Preventive Screening:  Has patient had any of the following test? Mammography (1)   Last Mammography:  February 2  016    Smoking History: Smoking History Never Smoked.(1) ADVANCED DIRECTIVES Patient does have advance healthcare directive, Patient   does not desire to make any changes  HEALTH MAINTENANCE: History  Substance Use Topics  . Smoking status: Never Smoker   . Smokeless tobacco: Not on file  . Alcohol Use: Not on file      Allergies  Allergen Reactions  . Penicillins Other (See Comments) and Rash    febrile  . Sulfa Antibiotics Other (See Comments) and Rash    febrile    Current Outpatient Prescriptions  Medication Sig Dispense Refill  . atorvastatin (LIPITOR) 20 MG tablet TAKE 1 TABLET BY MOUTH AT BEDTIME    . mesalamine (LIALDA) 1.2 G EC tablet TAKE 2 TABLET BY MOUTH ONCE A DAY    . omeprazole (PRILOSEC) 20 MG capsule Take by mouth.    . oxybutynin (DITROPAN) 5 MG tablet Take by mouth.    . Calcium Carbonate-Vitamin D 600-400 MG-UNIT per tablet Take by mouth.    . doxycycline (PERIOSTAT) 20 MG tablet Take by mouth.    . letrozole (FEMARA) 2.5 MG tablet Take by mouth.    . metoprolol succinate (TOPROL-XL) 50 MG 24 hr tablet Take by mouth.    . tobramycin-dexamethasone (TOBRADEX) ophthalmic solution PLACE 1 DROP IN EACH AFFECTED EYE(S) EVERY 3 HOURS  1   No current facility-administered medications for this visit.    OBJECTIVE:  Filed Vitals:   09/14/14 1533  BP:  141/68  Pulse: 67  Temp: 98 F (36.7 C)     There is no height on file to calculate BMI.    ECOG FS:0 - Asymptomatic  PHYSICAL EXAM: GENERAL:  Well developed, well nourished, sitting comfortably in the exam room in no acute distress. MENTAL STATUS:  Alert and oriented to person, place and time. HEAD:  .  Normocephalic, atraumatic, face symmetric, no Cushingoid features. EYES: .  Pupils equal round and reactive to light and accomodation.  No conjunctivitis or scleral icterus. .  RESPIRATORY:  Clear to auscultation without rales, wheezes or rhonchi. CARDIOVASCULAR:  Regular rate and rhythm without murmur,  rub or gallop. BREAST:  Right breast without masses, skin changes or nipple discharge.  Left breast without masses, skin changes or nipple discharge. ABDOMEN:  Soft, non-tender, with active bowel sounds, and no hepatosplenomegaly.  No masses. BACK:  No CVA tenderness.  No tenderness on percussion of the back or rib cage. SKIN:  No rashes, ulcers or lesions. EXTREMITIES: No edema, no skin discoloration or tenderness.  No palpable cords. LYMPH NODES: No palpable cervical, supraclavicular, axillary or inguinal adenopathy  NEUROLOGICAL: Unremarkable. PSYCH:  Appropriate.  LAB RESULTS:  Appointment on 09/14/2014  Component Date Value Ref Range Status  . WBC 09/14/2014 5.8  3.6 - 11.0 K/uL Final  . RBC 09/14/2014 4.43  3.80 - 5.20 MIL/uL Final  . Hemoglobin 09/14/2014 13.6  12.0 - 16.0 g/dL Final  . HCT 09/14/2014 41.3  35.0 - 47.0 % Final  . MCV 09/14/2014 93.1  80.0 - 100.0 fL Final  . MCH 09/14/2014 30.8  26.0 - 34.0 pg Final  . MCHC 09/14/2014 33.0  32.0 - 36.0 g/dL Final  . RDW 09/14/2014 13.5  11.5 - 14.5 % Final  . Platelets 09/14/2014 264  150 - 440 K/uL Final  . Neutrophils Relative % 09/14/2014 63   Final  . Neutro Abs 09/14/2014 3.6  1.4 - 6.5 K/uL Final  . Lymphocytes Relative 09/14/2014 27   Final  . Lymphs Abs 09/14/2014 1.6  1.0 - 3.6 K/uL Final  . Monocytes Relative 09/14/2014 6   Final  . Monocytes Absolute 09/14/2014 0.3  0.2 - 0.9 K/uL Final  . Eosinophils Relative 09/14/2014 3   Final  . Eosinophils Absolute 09/14/2014 0.2  0 - 0.7 K/uL Final  . Basophils Relative 09/14/2014 1   Final  . Basophils Absolute 09/14/2014 0.0  0 - 0.1 K/uL Final  . Sodium 09/14/2014 135  135 - 145 mmol/L Final  . Potassium 09/14/2014 3.9  3.5 - 5.1 mmol/L Final  . Chloride 09/14/2014 103  101 - 111 mmol/L Final  . CO2 09/14/2014 28  22 - 32 mmol/L Final  . Glucose, Bld 09/14/2014 95  65 - 99 mg/dL Final  . BUN 09/14/2014 13  6 - 20 mg/dL Final  . Creatinine, Ser 09/14/2014 0.62  0.44 -  1.00 mg/dL Final  . Calcium 09/14/2014 8.4* 8.9 - 10.3 mg/dL Final  . Total Protein 09/14/2014 6.9  6.5 - 8.1 g/dL Final  . Albumin 09/14/2014 3.8  3.5 - 5.0 g/dL Final  . AST 09/14/2014 18  15 - 41 U/L Final  . ALT 09/14/2014 26  14 - 54 U/L Final  . Alkaline Phosphatase 09/14/2014 87  38 - 126 U/L Final  . Total Bilirubin 09/14/2014 0.7  0.3 - 1.2 mg/dL Final  . GFR calc non Af Amer 09/14/2014 >60  >60 mL/min Final  . GFR calc Af Amer 09/14/2014 >60  >60  mL/min Final   Comment: (NOTE) The eGFR has been calculated using the CKD EPI equation. This calculation has not been validated in all clinical situations. eGFR's persistently <60 mL/min signify possible Chronic Kidney Disease.   . Anion gap 09/14/2014 4* 5 - 15 Final        ASSESSMENT: Cancer of eft breast stage I on for meropenem.  Mammogram is been reviewed in done on February of 2016.  Bone density normal bone without any osteoporosis or osteopenia.  MEDICAL DECISION MAKING:  Continue for meropenem.  Repeat mammogram in February of 2017 and reevaluate patient  Patient expressed understanding and was in agreement with this plan. She also understands that She can call clinic at any time with any questions, concerns, or complaints.    No matching staging information was found for the patient.  Forest Gleason, MD   09/14/2014 3:55 PM

## 2014-09-14 NOTE — Progress Notes (Signed)
Patient does have living will.  Will bring with her for our records.  Never smoked.  C/o joint pain today.  Also c/o right leg pain that comes and goes.

## 2014-10-10 ENCOUNTER — Other Ambulatory Visit: Payer: Self-pay | Admitting: Oncology

## 2014-10-16 ENCOUNTER — Other Ambulatory Visit: Payer: Self-pay | Admitting: Oncology

## 2014-10-18 ENCOUNTER — Telehealth: Payer: Self-pay | Admitting: *Deleted

## 2014-10-18 NOTE — Telephone Encounter (Signed)
Escribed

## 2014-12-28 DIAGNOSIS — E7800 Pure hypercholesterolemia, unspecified: Secondary | ICD-10-CM | POA: Insufficient documentation

## 2015-02-09 ENCOUNTER — Other Ambulatory Visit: Payer: Self-pay | Admitting: Oncology

## 2015-04-12 ENCOUNTER — Ambulatory Visit
Admission: RE | Admit: 2015-04-12 | Discharge: 2015-04-12 | Disposition: A | Discharge status: Home / Self Care | Payer: Federal, State, Local not specified - PPO | Source: Ambulatory Visit | Attending: Oncology | Admitting: Oncology

## 2015-04-12 ENCOUNTER — Other Ambulatory Visit: Payer: Self-pay | Admitting: Oncology

## 2015-04-12 DIAGNOSIS — C50912 Malignant neoplasm of unspecified site of left female breast: Secondary | ICD-10-CM

## 2015-04-12 DIAGNOSIS — Z853 Personal history of malignant neoplasm of breast: Secondary | ICD-10-CM | POA: Diagnosis not present

## 2015-04-12 HISTORY — DX: Malignant neoplasm of unspecified site of unspecified female breast: C50.919

## 2015-04-14 ENCOUNTER — Inpatient Hospital Stay: Payer: Federal, State, Local not specified - PPO | Admitting: Oncology

## 2015-04-14 ENCOUNTER — Inpatient Hospital Stay: Payer: Federal, State, Local not specified - PPO | Attending: Oncology

## 2015-04-14 DIAGNOSIS — Z803 Family history of malignant neoplasm of breast: Secondary | ICD-10-CM | POA: Insufficient documentation

## 2015-04-14 DIAGNOSIS — C50912 Malignant neoplasm of unspecified site of left female breast: Secondary | ICD-10-CM | POA: Insufficient documentation

## 2015-04-14 DIAGNOSIS — Z79899 Other long term (current) drug therapy: Secondary | ICD-10-CM | POA: Insufficient documentation

## 2015-04-14 DIAGNOSIS — Z8041 Family history of malignant neoplasm of ovary: Secondary | ICD-10-CM | POA: Insufficient documentation

## 2015-04-14 DIAGNOSIS — Z170 Estrogen receptor positive status [ER+]: Secondary | ICD-10-CM | POA: Insufficient documentation

## 2015-04-22 ENCOUNTER — Encounter: Payer: Self-pay | Admitting: Emergency Medicine

## 2015-04-22 ENCOUNTER — Emergency Department
Admission: EM | Admit: 2015-04-22 | Discharge: 2015-04-22 | Disposition: A | Discharge status: Home / Self Care | Payer: Federal, State, Local not specified - PPO | Attending: Emergency Medicine | Admitting: Emergency Medicine

## 2015-04-22 ENCOUNTER — Emergency Department: Payer: Federal, State, Local not specified - PPO

## 2015-04-22 DIAGNOSIS — R1013 Epigastric pain: Secondary | ICD-10-CM | POA: Diagnosis present

## 2015-04-22 DIAGNOSIS — K828 Other specified diseases of gallbladder: Secondary | ICD-10-CM | POA: Diagnosis not present

## 2015-04-22 DIAGNOSIS — Z79899 Other long term (current) drug therapy: Secondary | ICD-10-CM | POA: Insufficient documentation

## 2015-04-22 DIAGNOSIS — Z792 Long term (current) use of antibiotics: Secondary | ICD-10-CM | POA: Diagnosis not present

## 2015-04-22 DIAGNOSIS — Z880 Allergy status to penicillin: Secondary | ICD-10-CM | POA: Diagnosis not present

## 2015-04-22 LAB — COMPREHENSIVE METABOLIC PANEL
ALT: 33 U/L (ref 14–54)
ANION GAP: 8 (ref 5–15)
AST: 24 U/L (ref 15–41)
Albumin: 3.8 g/dL (ref 3.5–5.0)
Alkaline Phosphatase: 94 U/L (ref 38–126)
BUN: 17 mg/dL (ref 6–20)
CO2: 27 mmol/L (ref 22–32)
Calcium: 8.8 mg/dL — ABNORMAL LOW (ref 8.9–10.3)
Chloride: 105 mmol/L (ref 101–111)
Creatinine, Ser: 0.65 mg/dL (ref 0.44–1.00)
GFR calc non Af Amer: >60 mL/min (ref >60)
GLUCOSE: 148 mg/dL — AB (ref 65–99)
POTASSIUM: 3.5 mmol/L (ref 3.5–5.1)
SODIUM: 140 mmol/L (ref 135–145)
TOTAL PROTEIN: 7.1 g/dL (ref 6.5–8.1)
Total Bilirubin: 0.6 mg/dL (ref 0.3–1.2)

## 2015-04-22 LAB — CBC
HEMATOCRIT: 42.8 % (ref 35.0–47.0)
HEMOGLOBIN: 14.7 g/dL (ref 12.0–16.0)
MCH: 31.7 pg (ref 26.0–34.0)
MCHC: 34.4 g/dL (ref 32.0–36.0)
MCV: 92.1 fL (ref 80.0–100.0)
Platelets: 258 10*3/uL (ref 150–440)
RBC: 4.65 MIL/uL (ref 3.80–5.20)
RDW: 13.7 % (ref 11.5–14.5)
WBC: 5.6 10*3/uL (ref 3.6–11.0)

## 2015-04-22 LAB — TROPONIN I: Troponin I: <0.03 ng/mL (ref <0.031)

## 2015-04-22 LAB — LIPASE, BLOOD: Lipase: 35 U/L (ref 11–51)

## 2015-04-22 MED ORDER — TRAMADOL HCL 50 MG PO TABS: 50.0000 mg | ORAL_TABLET | Freq: Four times a day (QID) | ORAL | Status: DC | PRN

## 2015-04-22 MED ORDER — ONDANSETRON HCL 4 MG PO TABS: 4.0000 mg | ORAL_TABLET | Freq: Three times a day (TID) | ORAL | Status: DC | PRN

## 2015-04-22 NOTE — ED Notes (Signed)
Pt c/o epigastric pain that started yesterday.  Pt denies n/v, SOB, LOC or pain at this time.  Pt sts that pain was positional and sharp.  Pt sts pain radiated to back.  NAD.  Ambulatory to room

## 2015-04-22 NOTE — ED Provider Notes (Signed)
[facility] Emergency Department Provider Note    ____________________________________________  Time seen: ~1420  I have reviewed the triage vital signs and the nursing notes.   HISTORY  Chief Complaint Abdominal Pain   History limited by: Not Limited   HPI Debbie Floyd is a 60 y.o. female who presents to the emergency department today because of concerns for epigastric pain. She states that it started yesterday. It was located in the epigastric with some radiation up into the middle chest. She describes it as being sharp. It was intermittent. It was worse shortly after eating dinner. She states that she felt like she tasted some bile. She denies any associated shortness of breath or fevers. She states she talked to her GI doctors office today who recommended she be evaluated in emergency department. She states she had a gallstone identified by imaging roughly 3 years ago.     Past Medical History  Diagnosis Date  . Cancer of left breast ([place]) 09/14/2014  . Breast cancer ([place]) 2014    lumpectomy lt    Patient Active Problem List   Diagnosis Date Noted  . Cancer of left breast ([place]) 09/14/2014  . Acid reflux 09/14/2014  . History of palpitations 09/14/2014  . H/O malignant neoplasm of breast 09/14/2014  . Acne erythematosa 09/14/2014  . Colitis gravis ([place]) 09/14/2014  . HLD (hyperlipidemia) 11/23/2013    Past Surgical History  Procedure Laterality Date  . Breast lumpectomy Left 2014    with radiation  . Oophorectomy Bilateral 2014    Current Outpatient Rx  Name  Route  Sig  Dispense  Refill  . atorvastatin (LIPITOR) 20 MG tablet      TAKE 1 TABLET BY MOUTH AT BEDTIME         . Calcium Carb-Cholecalciferol (CALCIUM + D3) 600-200 MG-UNIT TABS      TAKE 1 TABLET BY MOUTH TWICE A DAY   180 tablet   4   . Calcium Carbonate-Vitamin D 600-400 MG-UNIT per tablet   Oral   Take by mouth.         . doxycycline (PERIOSTAT) 20 MG  tablet   Oral   Take by mouth.         . letrozole (FEMARA) 2.5 MG tablet      TAKE 1 TABLET BY MOUTH EVERY DAY   30 tablet   6   . mesalamine (LIALDA) 1.2 G EC tablet      TAKE 2 TABLET BY MOUTH ONCE A DAY         . metoprolol succinate (TOPROL-XL) 50 MG 24 hr tablet   Oral   Take by mouth.         Marland Kitchen omeprazole (PRILOSEC) 20 MG capsule   Oral   Take by mouth.         . oxybutynin (DITROPAN) 5 MG tablet   Oral   Take by mouth.         . tobramycin-dexamethasone (TOBRADEX) ophthalmic solution      PLACE 1 DROP IN EACH AFFECTED EYE(S) EVERY 3 HOURS      1     Allergies Penicillins and Sulfa antibiotics  Family History  Problem Relation Age of Onset  . Breast cancer Paternal Aunt   . Ovarian cancer Maternal Grandmother 80    Social History Social History  Substance Use Topics  . Smoking status: Never Smoker   . Smokeless tobacco: None  . Alcohol Use: None    Review of Systems  Constitutional: Negative for fever. Cardiovascular: Negative for chest pain. Respiratory: Negative for shortness of breath. Gastrointestinal: Positive for epigastric pain Neurological: Negative for headaches, focal weakness or numbness.   10-point ROS otherwise negative.  ____________________________________________   PHYSICAL EXAM:  VITAL SIGNS: ED Triage Vitals  Enc Vitals Group     BP 04/22/15 1112 151/79 mmHg     Pulse Rate 04/22/15 1112 83     Resp 04/22/15 1112 18     Temp 04/22/15 1112 98.7 F (37.1 C)     Temp Source 04/22/15 1112 Oral     SpO2 04/22/15 1112 96 %     Weight 04/22/15 1112 162 lb (73.483 kg)     Height 04/22/15 1112 5' 5"  (1.651 m)   Constitutional: Alert and oriented. Well appearing and in no distress. Eyes: Conjunctivae are normal. PERRL. Normal extraocular movements. ENT   Head: Normocephalic and atraumatic.   Nose: No congestion/rhinnorhea.   Mouth/Throat: Mucous membranes are moist.   Neck: No  stridor. Hematological/Lymphatic/Immunilogical: No cervical lymphadenopathy. Cardiovascular: Normal rate, regular rhythm.  No murmurs, rubs, or gallops. Respiratory: Normal respiratory effort without tachypnea nor retractions. Breath sounds are clear and equal bilaterally. No wheezes/rales/rhonchi. Gastrointestinal: Soft and with very minimal epigastric tenderness. No rebound. No guarding. No distention. Genitourinary: Deferred Musculoskeletal: Normal range of motion in all extremities. No joint effusions.  No lower extremity tenderness nor edema. Neurologic:  Normal speech and language. No gross focal neurologic deficits are appreciated.  Skin:  Skin is warm, dry and intact. No rash noted. Psychiatric: Mood and affect are normal. Speech and behavior are normal. Patient exhibits appropriate insight and judgment.  ____________________________________________    LABS (pertinent positives/negatives)  Labs Reviewed  COMPREHENSIVE METABOLIC PANEL - Abnormal; Notable for the following:    Glucose, Bld 148 (*)    Calcium 8.8 (*)    All other components within normal limits  LIPASE, BLOOD  CBC  TROPONIN I  URINALYSIS COMPLETEWITH MICROSCOPIC ([facility] ONLY)     ____________________________________________   EKG  I, Nance Pear, attending physician, personally viewed and interpreted this EKG  EKG Time: 1118 Rate: 79 Rhythm: normal sinus rhythm Axis: normal Intervals: qtc 403 QRS: narrow, LVH ST changes: no st elevation Impression: abnormal ekg ____________________________________________    RADIOLOGY  RUQ US IMPRESSION: Gallbladder sludge and probable tiny admixed calculi. Gallbladder distention but no inflammatory changes for acute cholecystitis.  ____________________________________________   PROCEDURES  Procedure(s) performed: None  Critical Care performed: No  ____________________________________________   INITIAL IMPRESSION / ASSESSMENT AND PLAN / ED  COURSE  Pertinent labs & imaging results that were available during my care of the patient were reviewed by me and considered in my medical decision making (see chart for details).  Patient presented to the emergency department today because of concerns for epigastric pain. I highly doubt ACS at this time given negative troponin, and concerning EKG and lack of risk factors. Patient does have a history of ulcerative colitis however states she has not had any concerning change in bowels recently. I would consider gallbladder disease and thus will obtain an ultrasound. Lipase and LFTs within normal limits.  ----------------------------------------- 4:20 PM on 04/22/2015 -----------------------------------------  Patient's ultrasound did not show any evidence of cholecystitis. Push small calculi and gallbladder sludge. I discussed this finding with the patient. At this point I think biliary colic likely. Also would consider worsening of GERD. Again I think ACS unlikely. Did encourage follow-up with primary care and surgery.  ____________________________________________   FINAL CLINICAL IMPRESSION(S) /  ED DIAGNOSES  Final diagnoses:  Epigastric abdominal pain  Gallbladder sludge     Nance Pear, MD 04/22/15 [phone]

## 2015-04-22 NOTE — Discharge Instructions (Signed)
Please seek medical attention for any high fevers, chest pain, shortness of breath, change in behavior, persistent vomiting, bloody stool or any other new or concerning symptoms.   Abdominal Pain, Adult Many things can cause belly (abdominal) pain. Most times, the belly pain is not dangerous. Many cases of belly pain can be watched and treated at home. HOME CARE   Do not take medicines that help you go poop (laxatives) unless told to by your doctor.  Only take medicine as told by your doctor.  Eat or drink as told by your doctor. Your doctor will tell you if you should be on a special diet. GET HELP IF:  You do not know what is causing your belly pain.  You have belly pain while you are sick to your stomach (nauseous) or have runny poop (diarrhea).  You have pain while you pee or poop.  Your belly pain wakes you up at night.  You have belly pain that gets worse or better when you eat.  You have belly pain that gets worse when you eat fatty foods.  You have a fever. GET HELP RIGHT AWAY IF:   The pain does not go away within 2 hours.  You keep throwing up (vomiting).  The pain changes and is only in the right or left part of the belly.  You have bloody or tarry looking poop. MAKE SURE YOU:   Understand these instructions.  Will watch your condition.  Will get help right away if you are not doing well or get worse.   This information is not intended to replace advice given to you by your health care provider. Make sure you discuss any questions you have with your health care provider.   Document Released: 08/08/2007 Document Revised: 03/12/2014 Document Reviewed: 10/29/2012 Elsevier Interactive Patient Education [employer].

## 2015-04-22 NOTE — ED Notes (Addendum)
Developed sharp epigastric pain last pm.. Pos nausea  States pain radiates into mid back  But also had some discomfort in jaw last pm

## 2015-04-27 ENCOUNTER — Inpatient Hospital Stay (HOSPITAL_BASED_OUTPATIENT_CLINIC_OR_DEPARTMENT_OTHER): Payer: Federal, State, Local not specified - PPO | Admitting: Oncology

## 2015-04-27 ENCOUNTER — Inpatient Hospital Stay: Payer: Federal, State, Local not specified - PPO

## 2015-04-27 VITALS — BP 154/88 | HR 74 | Temp 97.8°F | Resp 18 | Wt 166.7 lb

## 2015-04-27 DIAGNOSIS — C50412 Malignant neoplasm of upper-outer quadrant of left female breast: Secondary | ICD-10-CM

## 2015-04-27 DIAGNOSIS — Z170 Estrogen receptor positive status [ER+]: Secondary | ICD-10-CM

## 2015-04-27 DIAGNOSIS — Z79899 Other long term (current) drug therapy: Secondary | ICD-10-CM | POA: Diagnosis not present

## 2015-04-27 DIAGNOSIS — Z79811 Long term (current) use of aromatase inhibitors: Secondary | ICD-10-CM | POA: Diagnosis not present

## 2015-04-27 DIAGNOSIS — C50912 Malignant neoplasm of unspecified site of left female breast: Secondary | ICD-10-CM | POA: Diagnosis not present

## 2015-04-27 DIAGNOSIS — Z8041 Family history of malignant neoplasm of ovary: Secondary | ICD-10-CM | POA: Diagnosis not present

## 2015-04-27 DIAGNOSIS — R232 Flushing: Secondary | ICD-10-CM | POA: Diagnosis not present

## 2015-04-27 DIAGNOSIS — Z923 Personal history of irradiation: Secondary | ICD-10-CM

## 2015-04-27 DIAGNOSIS — Z803 Family history of malignant neoplasm of breast: Secondary | ICD-10-CM | POA: Diagnosis not present

## 2015-04-27 LAB — COMPREHENSIVE METABOLIC PANEL
ALK PHOS: 80 U/L (ref 38–126)
ALT: 24 U/L (ref 14–54)
AST: 17 U/L (ref 15–41)
Albumin: 4.1 g/dL (ref 3.5–5.0)
Anion gap: 5 (ref 5–15)
BILIRUBIN TOTAL: 0.5 mg/dL (ref 0.3–1.2)
BUN: 16 mg/dL (ref 6–20)
CALCIUM: 8.7 mg/dL — AB (ref 8.9–10.3)
CO2: 27 mmol/L (ref 22–32)
CREATININE: 0.65 mg/dL (ref 0.44–1.00)
Chloride: 105 mmol/L (ref 101–111)
GFR calc Af Amer: >60 mL/min (ref >60)
Glucose, Bld: 93 mg/dL (ref 65–99)
POTASSIUM: 3.4 mmol/L — AB (ref 3.5–5.1)
Sodium: 137 mmol/L (ref 135–145)
TOTAL PROTEIN: 7.3 g/dL (ref 6.5–8.1)

## 2015-04-27 LAB — CBC WITH DIFFERENTIAL/PLATELET
BASOS PCT: 1 %
Basophils Absolute: 0.1 10*3/uL (ref 0–0.1)
EOS ABS: 0.2 10*3/uL (ref 0–0.7)
Eosinophils Relative: 3 %
HEMATOCRIT: 41.3 % (ref 35.0–47.0)
Hemoglobin: 14.1 g/dL (ref 12.0–16.0)
Lymphocytes Relative: 30 %
Lymphs Abs: 1.7 10*3/uL (ref 1.0–3.6)
MCH: 31.4 pg (ref 26.0–34.0)
MCHC: 34.1 g/dL (ref 32.0–36.0)
MCV: 92 fL (ref 80.0–100.0)
MONO ABS: 0.4 10*3/uL (ref 0.2–0.9)
MONOS PCT: 7 %
Neutro Abs: 3.5 10*3/uL (ref 1.4–6.5)
Neutrophils Relative %: 59 %
Platelets: 275 10*3/uL (ref 150–440)
RBC: 4.49 MIL/uL (ref 3.80–5.20)
RDW: 13.7 % (ref 11.5–14.5)
WBC: 5.8 10*3/uL (ref 3.6–11.0)

## 2015-04-27 NOTE — Progress Notes (Signed)
Patient states she went to ED on Saturday morning for a "gallbladder attack".  States she has appointment with Dr. Rochel Brome next week.

## 2015-04-27 NOTE — Progress Notes (Signed)
[place]  [phone]  Fax:([phone]) [place] DOB: 16-Jan-1956  MR#: [id number]  JYN#:[id number]  Patient Care Team: Derinda Late, MD as PCP - General (Family Medicine)  CHIEF COMPLAINT:  Chief Complaint  Patient presents with  . Breast Cancer    INTERVAL HISTORY: Patient is here for further follow-up and treatment consideration regarding carcinoma of left breast. She is status post wide local excision as well as radiation therapy. She also underwent a bilateral oophorectomy in November 2014. Patient is currently tolerating letrozole, calcium, and vitamin D with no complaints. Her most recent mammogram was on 04/22/2015 and reported as BI-RADS 2. Does report having some mild hot flashes but nothing that is not manageable. She overall reports feeling very well and denies any acute complaints.  REVIEW OF SYSTEMS:   Review of Systems  Constitutional: Negative for fever, chills, weight loss, malaise/fatigue and diaphoresis.  HENT: Negative.   Eyes: Negative.   Respiratory: Negative for cough, hemoptysis, sputum production, shortness of breath and wheezing.   Cardiovascular: Negative for chest pain, palpitations, orthopnea, claudication, leg swelling and PND.  Gastrointestinal: Negative for heartburn, nausea, vomiting, abdominal pain, diarrhea, constipation, blood in stool and melena.  Genitourinary: Negative.   Musculoskeletal: Negative.   Skin: Negative.   Neurological: Negative for dizziness, tingling, focal weakness, seizures and weakness.  Endo/Heme/Allergies: Does not bruise/bleed easily.  Psychiatric/Behavioral: Negative for depression. The patient is not nervous/anxious and does not have insomnia.     As per HPI. Otherwise, a complete review of systems is negatve.  ONCOLOGY HISTORY: Oncology History   60 year old female with pathologic stage Ib (T1 B. N0 M0) invasive mammary carcinoma status post wide local excision and sentinel  node biopsy ER/PR positive HER-2/neu negative by fish 2,Oncotype Dx  score is 6% (low) 3.  Finished radiation therapy may 2014 4,  Femara of  June of 2014 5.patient had bilateral oophorectomy inNovember of 2014     Cancer of left breast [facility])    PAST MEDICAL HISTORY: Past Medical History  Diagnosis Date  . Cancer of left breast ([place]) 09/14/2014  . Breast cancer ([place]) 2014    lumpectomy lt    PAST SURGICAL HISTORY: Past Surgical History  Procedure Laterality Date  . Breast lumpectomy Left 2014    with radiation  . Oophorectomy Bilateral 2014    FAMILY HISTORY Family History  Problem Relation Age of Onset  . Breast cancer Paternal Aunt   . Ovarian cancer Maternal Grandmother 80    GYNECOLOGIC HISTORY:  No LMP recorded. Patient is postmenopausal.     ADVANCED DIRECTIVES:    HEALTH MAINTENANCE: Social History  Substance Use Topics  . Smoking status: Never Smoker   . Smokeless tobacco: Not on file  . Alcohol Use: Not on file     Colonoscopy:  PAP:  Bone density:  Mammogram: 04/2015  Allergies  Allergen Reactions  . Penicillins Other (See Comments) and Rash    febrile  . Sulfa Antibiotics Other (See Comments) and Rash    febrile    Current Outpatient Prescriptions  Medication Sig Dispense Refill  . atorvastatin (LIPITOR) 20 MG tablet TAKE 1 TABLET BY MOUTH AT BEDTIME    . Calcium Carb-Cholecalciferol (CALCIUM + D3) 600-200 MG-UNIT TABS TAKE 1 TABLET BY MOUTH TWICE A DAY 180 tablet 4  . Calcium Carbonate-Vitamin D 600-400 MG-UNIT per tablet Take by mouth.    . doxycycline (PERIOSTAT) 20 MG tablet Take by mouth.    . letrozole (  FEMARA) 2.5 MG tablet TAKE 1 TABLET BY MOUTH EVERY DAY 30 tablet 6  . mesalamine (LIALDA) 1.2 G EC tablet TAKE 2 TABLET BY MOUTH ONCE A DAY    . metoprolol succinate (TOPROL-XL) 50 MG 24 hr tablet Take by mouth.    Marland Kitchen omeprazole (PRILOSEC) 20 MG capsule Take by mouth.    . ondansetron (ZOFRAN) 4 MG tablet Take 1 tablet (4 mg total) by  mouth every 8 (eight) hours as needed for nausea or vomiting. 20 tablet 0  . oxybutynin (DITROPAN) 5 MG tablet Take by mouth.    . tobramycin-dexamethasone (TOBRADEX) ophthalmic solution PLACE 1 DROP IN EACH AFFECTED EYE(S) EVERY 3 HOURS  1  . traMADol (ULTRAM) 50 MG tablet Take 1 tablet (50 mg total) by mouth every 6 (six) hours as needed for moderate pain or severe pain. 20 tablet 0   No current facility-administered medications for this visit.    OBJECTIVE: BP 154/88 mmHg  Pulse 74  Temp(Src) 97.8 F (36.6 C) (Tympanic)  Resp 18  Wt 166 lb 10.7 oz (75.6 kg)   Body mass index is 27.73 kg/(m^2).    ECOG FS:0 - Asymptomatic  General: Well-developed, well-nourished, no acute distress. Eyes: Pink conjunctiva, anicteric sclera. HEENT: Normocephalic, moist mucous membranes, clear oropharnyx. Lungs: Clear to auscultation bilaterally. Heart: Regular rate and rhythm. No rubs, murmurs, or gallops. Abdomen: Soft, nontender, nondistended. No organomegaly noted, normoactive bowel sounds. Breast: Breast palpated in a circular manner in the sitting and supine positions.  No masses or fullness palpated.  Axilla palpated in both positions with no masses or fullness palpated.  Musculoskeletal: No edema, cyanosis, or clubbing. Neuro: Alert, answering all questions appropriately. Cranial nerves grossly intact. Skin: No rashes or petechiae noted. Psych: Normal affect. Lymphatics: No cervical, clavicular, or axillary LAD.   LAB RESULTS:  Appointment on 04/27/2015  Component Date Value Ref Range Status  . WBC 04/27/2015 5.8  3.6 - 11.0 K/uL Final  . RBC 04/27/2015 4.49  3.80 - 5.20 MIL/uL Final  . Hemoglobin 04/27/2015 14.1  12.0 - 16.0 g/dL Final  . HCT 04/27/2015 41.3  35.0 - 47.0 % Final  . MCV 04/27/2015 92.0  80.0 - 100.0 fL Final  . MCH 04/27/2015 31.4  26.0 - 34.0 pg Final  . MCHC 04/27/2015 34.1  32.0 - 36.0 g/dL Final  . RDW 04/27/2015 13.7  11.5 - 14.5 % Final  . Platelets 04/27/2015  275  150 - 440 K/uL Final  . Neutrophils Relative % 04/27/2015 59   Final  . Neutro Abs 04/27/2015 3.5  1.4 - 6.5 K/uL Final  . Lymphocytes Relative 04/27/2015 30   Final  . Lymphs Abs 04/27/2015 1.7  1.0 - 3.6 K/uL Final  . Monocytes Relative 04/27/2015 7   Final  . Monocytes Absolute 04/27/2015 0.4  0.2 - 0.9 K/uL Final  . Eosinophils Relative 04/27/2015 3   Final  . Eosinophils Absolute 04/27/2015 0.2  0 - 0.7 K/uL Final  . Basophils Relative 04/27/2015 1   Final  . Basophils Absolute 04/27/2015 0.1  0 - 0.1 K/uL Final  . Sodium 04/27/2015 137  135 - 145 mmol/L Final  . Potassium 04/27/2015 3.4* 3.5 - 5.1 mmol/L Final  . Chloride 04/27/2015 105  101 - 111 mmol/L Final  . CO2 04/27/2015 27  22 - 32 mmol/L Final  . Glucose, Bld 04/27/2015 93  65 - 99 mg/dL Final  . BUN 04/27/2015 16  6 - 20 mg/dL Final  . Creatinine, Ser 04/27/2015  0.65  0.44 - 1.00 mg/dL Final  . Calcium 04/27/2015 8.7* 8.9 - 10.3 mg/dL Final  . Total Protein 04/27/2015 7.3  6.5 - 8.1 g/dL Final  . Albumin 04/27/2015 4.1  3.5 - 5.0 g/dL Final  . AST 04/27/2015 17  15 - 41 U/L Final  . ALT 04/27/2015 24  14 - 54 U/L Final  . Alkaline Phosphatase 04/27/2015 80  38 - 126 U/L Final  . Total Bilirubin 04/27/2015 0.5  0.3 - 1.2 mg/dL Final  . GFR calc non Af Amer 04/27/2015 >60  >60 mL/min Final  . GFR calc Af Amer 04/27/2015 >60  >60 mL/min Final   Comment: (NOTE) The eGFR has been calculated using the CKD EPI equation. This calculation has not been validated in all clinical situations. eGFR's persistently <60 mL/min signify possible Chronic Kidney Disease.   . Anion gap 04/27/2015 5  5 - 15 Final    STUDIES: No results found.  ASSESSMENT:  Carcinoma of left breast, stage I, ER/PR positive.  PLAN:   1. Left breast cancer. Mammogram from February 2017 reported as negative. Patient is tolerating letrozole, calcium, and vitamin D with no complaints other than mild hot flashes. Clinically there is no evidence of  recurrent disease. Patient also had a recent bone density in January 2016 that was reported as having no evidence of osteoporosis or osteopenia.  We will continue with routine follow-up in 6 months.  Patient expressed understanding and was in agreement with this plan. She also understands that She can call clinic at any time with any questions, concerns, or complaints.   Dr. Oliva Bustard was available for consultation and review of plan of care for this patient.  Stage I, left breast cancer  Evlyn Kanner, NP   04/27/2015 4:06 PM

## 2015-07-19 ENCOUNTER — Other Ambulatory Visit: Payer: Federal, State, Local not specified - PPO

## 2015-07-19 ENCOUNTER — Encounter: Payer: Self-pay | Admitting: *Deleted

## 2015-07-19 NOTE — Patient Instructions (Signed)
  Your procedure is scheduled on: 07-26-15 Report to [place]. To find out your arrival time please call [phone] between 1PM - 3PM on 07-25-15  Remember: Instructions that are not followed completely may result in serious medical risk, up to and including death, or upon the discretion of your surgeon and anesthesiologist your surgery may need to be rescheduled.    _X___ 1. Do not eat food or drink liquids after midnight. No gum chewing or hard candies.     _X___ 2. No Alcohol for 24 hours before or after surgery.   ____ 3. Bring all medications with you on the day of surgery if instructed.    ____ 4. Notify your doctor if there is any change in your medical condition     (cold, fever, infections).     Do not wear jewelry, make-up, hairpins, clips or nail polish.  Do not wear lotions, powders, or perfumes. You may wear deodorant.  Do not shave 48 hours prior to surgery. Men may shave face and neck.  Do not bring valuables to the hospital.    [facility] is not responsible for any belongings or valuables.               Contacts, dentures or bridgework may not be worn into surgery.  Leave your suitcase in the car. After surgery it may be brought to your room.  For patients admitted to the hospital, discharge time is determined by your treatment team.   Patients discharged the day of surgery will not be allowed to drive home.   Please read over the following fact sheets that you were given:     _X___ Take these medicines the morning of surgery with A SIP OF WATER:    1. METOPRLOL  2. PRILOSEC  3. OXYBUTININ  4.  5.  6.  ____ Fleet Enema (as directed)   ____ Use CHG Soap as directed  ____ Use inhalers on the day of surgery  ____ Stop metformin 2 days prior to surgery    ____ Take 1/2 of usual insulin dose the night before surgery and none on the morning of surgery.   ____ Stop Coumadin/Plavix/aspirin-N/A  _X___ Stop  Anti-inflammatories-NO NSAIDS OR ASA PRODUCTS-TRAMADOL/TYLENOL OK TO TAKE   ____ Stop supplements until after surgery.    ____ Bring C-Pap to the hospital.

## 2015-07-26 ENCOUNTER — Encounter
Admission: RE | Disposition: A | Discharge status: Home / Self Care | Payer: Self-pay | Source: Ambulatory Visit | Attending: Surgery

## 2015-07-26 ENCOUNTER — Ambulatory Visit: Payer: Federal, State, Local not specified - PPO

## 2015-07-26 ENCOUNTER — Ambulatory Visit: Payer: Federal, State, Local not specified - PPO | Admitting: Anesthesiology

## 2015-07-26 ENCOUNTER — Encounter: Payer: Self-pay | Admitting: *Deleted

## 2015-07-26 ENCOUNTER — Ambulatory Visit
Admission: RE | Admit: 2015-07-26 | Discharge: 2015-07-26 | Disposition: A | Discharge status: Home / Self Care | Payer: Federal, State, Local not specified - PPO | Source: Ambulatory Visit | Attending: Surgery | Admitting: Surgery

## 2015-07-26 DIAGNOSIS — K8010 Calculus of gallbladder with chronic cholecystitis without obstruction: Secondary | ICD-10-CM | POA: Diagnosis not present

## 2015-07-26 DIAGNOSIS — Z806 Family history of leukemia: Secondary | ICD-10-CM | POA: Insufficient documentation

## 2015-07-26 DIAGNOSIS — Z79899 Other long term (current) drug therapy: Secondary | ICD-10-CM | POA: Insufficient documentation

## 2015-07-26 DIAGNOSIS — K8020 Calculus of gallbladder without cholecystitis without obstruction: Secondary | ICD-10-CM

## 2015-07-26 DIAGNOSIS — Z880 Allergy status to penicillin: Secondary | ICD-10-CM | POA: Diagnosis not present

## 2015-07-26 DIAGNOSIS — Z8041 Family history of malignant neoplasm of ovary: Secondary | ICD-10-CM | POA: Diagnosis not present

## 2015-07-26 DIAGNOSIS — K5190 Ulcerative colitis, unspecified, without complications: Secondary | ICD-10-CM | POA: Insufficient documentation

## 2015-07-26 DIAGNOSIS — Z807 Family history of other malignant neoplasms of lymphoid, hematopoietic and related tissues: Secondary | ICD-10-CM | POA: Insufficient documentation

## 2015-07-26 DIAGNOSIS — K219 Gastro-esophageal reflux disease without esophagitis: Secondary | ICD-10-CM | POA: Insufficient documentation

## 2015-07-26 DIAGNOSIS — L719 Rosacea, unspecified: Secondary | ICD-10-CM | POA: Diagnosis not present

## 2015-07-26 DIAGNOSIS — Z8042 Family history of malignant neoplasm of prostate: Secondary | ICD-10-CM | POA: Insufficient documentation

## 2015-07-26 DIAGNOSIS — Z882 Allergy status to sulfonamides status: Secondary | ICD-10-CM | POA: Diagnosis not present

## 2015-07-26 DIAGNOSIS — R002 Palpitations: Secondary | ICD-10-CM | POA: Diagnosis not present

## 2015-07-26 DIAGNOSIS — Z853 Personal history of malignant neoplasm of breast: Secondary | ICD-10-CM | POA: Insufficient documentation

## 2015-07-26 HISTORY — DX: Unspecified osteoarthritis, unspecified site: M19.90

## 2015-07-26 HISTORY — DX: Cardiac arrhythmia, unspecified: I49.9

## 2015-07-26 HISTORY — PX: CHOLECYSTECTOMY: SHX55

## 2015-07-26 HISTORY — DX: Gastro-esophageal reflux disease without esophagitis: K21.9

## 2015-07-26 SURGERY — LAPAROSCOPIC CHOLECYSTECTOMY WITH INTRAOPERATIVE CHOLANGIOGRAM
Anesthesia: General | Site: Abdomen | Wound class: Clean Contaminated

## 2015-07-26 MED ORDER — EPHEDRINE SULFATE 50 MG/ML IJ SOLN
INTRAMUSCULAR | Status: DC | PRN
  Administered 2015-07-26: 10 mg via INTRAVENOUS

## 2015-07-26 MED ORDER — ONDANSETRON HCL 4 MG/2ML IJ SOLN: 4.0000 mg | Freq: Once | INTRAMUSCULAR | Status: DC | PRN

## 2015-07-26 MED ORDER — FENTANYL CITRATE (PF) 100 MCG/2ML IJ SOLN
25.0000 ug | INTRAMUSCULAR | Status: DC | PRN
  Administered 2015-07-26 (×4): 25 ug via INTRAVENOUS

## 2015-07-26 MED ORDER — HEPARIN SODIUM (PORCINE) 5000 UNIT/ML IJ SOLN
INTRAMUSCULAR | Status: AC
  Filled 2015-07-26: qty 1

## 2015-07-26 MED ORDER — ACETAMINOPHEN 10 MG/ML IV SOLN
INTRAVENOUS | Status: DC | PRN
  Administered 2015-07-26: 1000 mg via INTRAVENOUS

## 2015-07-26 MED ORDER — DEXAMETHASONE SODIUM PHOSPHATE 10 MG/ML IJ SOLN
INTRAMUSCULAR | Status: DC | PRN
  Administered 2015-07-26: 8 mg via INTRAVENOUS

## 2015-07-26 MED ORDER — PROPOFOL 10 MG/ML IV BOLUS
INTRAVENOUS | Status: DC | PRN
  Administered 2015-07-26: 150 mg via INTRAVENOUS

## 2015-07-26 MED ORDER — SODIUM CHLORIDE 0.9 % IV SOLN: INTRAVENOUS | Status: DC | PRN

## 2015-07-26 MED ORDER — SODIUM CHLORIDE 0.9 % IJ SOLN
INTRAMUSCULAR | Status: AC
  Filled 2015-07-26: qty 50

## 2015-07-26 MED ORDER — ONDANSETRON HCL 4 MG/2ML IJ SOLN
INTRAMUSCULAR | Status: DC | PRN
  Administered 2015-07-26: 4 mg via INTRAVENOUS

## 2015-07-26 MED ORDER — HEPARIN SODIUM (PORCINE) 5000 UNIT/ML IJ SOLN
INTRAMUSCULAR | Status: AC
Start: 2015-07-26 — End: 2015-07-26
  Filled 2015-07-26: qty 1

## 2015-07-26 MED ORDER — ROCURONIUM BROMIDE 100 MG/10ML IV SOLN
INTRAVENOUS | Status: DC | PRN
  Administered 2015-07-26: 10 mg via INTRAVENOUS
  Administered 2015-07-26: 30 mg via INTRAVENOUS

## 2015-07-26 MED ORDER — FENTANYL CITRATE (PF) 100 MCG/2ML IJ SOLN
INTRAMUSCULAR | Status: DC | PRN
  Administered 2015-07-26: 100 ug via INTRAVENOUS

## 2015-07-26 MED ORDER — SUCCINYLCHOLINE CHLORIDE 20 MG/ML IJ SOLN
INTRAMUSCULAR | Status: DC | PRN
  Administered 2015-07-26: 100 mg via INTRAVENOUS

## 2015-07-26 MED ORDER — SUGAMMADEX SODIUM 500 MG/5ML IV SOLN
INTRAVENOUS | Status: DC | PRN
  Administered 2015-07-26: 146 mg via INTRAVENOUS

## 2015-07-26 MED ORDER — ACETAMINOPHEN 10 MG/ML IV SOLN
INTRAVENOUS | Status: AC
  Filled 2015-07-26: qty 100

## 2015-07-26 MED ORDER — MIDAZOLAM HCL 2 MG/2ML IJ SOLN
INTRAMUSCULAR | Status: DC | PRN
  Administered 2015-07-26: 2 mg via INTRAVENOUS

## 2015-07-26 MED ORDER — HYDROCODONE-ACETAMINOPHEN 5-325 MG PO TABS
1.0000 | ORAL_TABLET | ORAL | Status: DC | PRN
  Administered 2015-07-26: 1 via ORAL

## 2015-07-26 MED ORDER — HYDROCODONE-ACETAMINOPHEN 5-325 MG PO TABS
ORAL_TABLET | ORAL | Status: AC
  Administered 2015-07-26: 1 via ORAL
  Filled 2015-07-26: qty 1

## 2015-07-26 MED ORDER — LIDOCAINE HCL (CARDIAC) 20 MG/ML IV SOLN
INTRAVENOUS | Status: DC | PRN
  Administered 2015-07-26: 80 mg via INTRAVENOUS

## 2015-07-26 MED ORDER — SODIUM CHLORIDE 0.9 % IV SOLN
INTRAVENOUS | Status: DC | PRN
  Administered 2015-07-26: 5 mL

## 2015-07-26 MED ORDER — FENTANYL CITRATE (PF) 100 MCG/2ML IJ SOLN
INTRAMUSCULAR | Status: AC
  Administered 2015-07-26: 25 ug via INTRAVENOUS
  Filled 2015-07-26: qty 2

## 2015-07-26 MED ORDER — HYDROCODONE-ACETAMINOPHEN 5-325 MG PO TABS: 1.0000 | ORAL_TABLET | ORAL | Status: DC | PRN

## 2015-07-26 MED ORDER — LACTATED RINGERS IV SOLN
INTRAVENOUS | Status: DC
  Administered 2015-07-26: 09:00:00 via INTRAVENOUS

## 2015-07-26 SURGICAL SUPPLY — 39 items
APPLIER CLIP ROT 10 11.4 M/L (STAPLE) ×3
CANISTER SUCT 1200ML W/VALVE (MISCELLANEOUS) ×3 IMPLANT
CANNULA DILATOR 10 W/SLV (CANNULA) ×2 IMPLANT
CANNULA DILATOR 10MM W/SLV (CANNULA) ×1
CATH REDDICK CHOLANGI 4FR 50CM (CATHETERS) ×6 IMPLANT
CHLORAPREP W/TINT 26ML (MISCELLANEOUS) ×3 IMPLANT
CLIP APPLIE ROT 10 11.4 M/L (STAPLE) ×1 IMPLANT
CLOSURE WOUND 1/2 X4 (GAUZE/BANDAGES/DRESSINGS) ×1
DRAPE SHEET LG 3/4 BI-LAMINATE (DRAPES) ×3 IMPLANT
ELECT REM PT RETURN 9FT ADLT (ELECTROSURGICAL) ×3
ELECTRODE REM PT RTRN 9FT ADLT (ELECTROSURGICAL) ×1 IMPLANT
GAUZE SPONGE 4X4 12PLY STRL (GAUZE/BANDAGES/DRESSINGS) ×3 IMPLANT
GLOVE BIO SURGEON STRL SZ7.5 (GLOVE) ×3 IMPLANT
GOWN STRL REUS W/ TWL LRG LVL3 (GOWN DISPOSABLE) ×4 IMPLANT
GOWN STRL REUS W/TWL LRG LVL3 (GOWN DISPOSABLE) ×8
IRRIGATION STRYKERFLOW (MISCELLANEOUS) ×1 IMPLANT
IRRIGATOR STRYKERFLOW (MISCELLANEOUS) ×3
IV NS 1000ML (IV SOLUTION) ×2
IV NS 1000ML BAXH (IV SOLUTION) ×1 IMPLANT
KIT RM TURNOVER STRD PROC AR (KITS) ×3 IMPLANT
LABEL OR SOLS (LABEL) ×3 IMPLANT
NDL INSUFF ACCESS 14 VERSASTEP (NEEDLE) ×3 IMPLANT
NEEDLE FILTER BLUNT 18X 1/2SAF (NEEDLE) ×2
NEEDLE FILTER BLUNT 18X1 1/2 (NEEDLE) ×1 IMPLANT
NS IRRIG 500ML POUR BTL (IV SOLUTION) ×3 IMPLANT
PACK LAP CHOLECYSTECTOMY (MISCELLANEOUS) ×3 IMPLANT
POUCH ENDO CATCH 10MM SPEC (MISCELLANEOUS) ×3 IMPLANT
SCISSORS METZENBAUM CVD 33 (INSTRUMENTS) ×3 IMPLANT
SEAL FOR SCOPE WARMER C3101 (MISCELLANEOUS) ×3 IMPLANT
SLEEVE ENDOPATH XCEL 5M (ENDOMECHANICALS) ×3 IMPLANT
STRIP CLOSURE SKIN 1/2X4 (GAUZE/BANDAGES/DRESSINGS) ×2 IMPLANT
SUT CHROMIC 5 0 RB 1 27 (SUTURE) ×3 IMPLANT
SUT MAXON ABS #0 GS21 30IN (SUTURE) ×3 IMPLANT
SUT VIC AB 0 CT2 27 (SUTURE) IMPLANT
SYR 3ML LL SCALE MARK (SYRINGE) ×3 IMPLANT
TROCAR XCEL NON-BLD 11X100MML (ENDOMECHANICALS) ×3 IMPLANT
TROCAR XCEL NON-BLD 5MMX100MML (ENDOMECHANICALS) ×3 IMPLANT
TUBING INSUFFLATOR HI FLOW (MISCELLANEOUS) ×3 IMPLANT
WATER STERILE IRR 1000ML POUR (IV SOLUTION) ×3 IMPLANT

## 2015-07-26 NOTE — Anesthesia Preprocedure Evaluation (Signed)
Anesthesia Evaluation  Patient identified by MRN, date of birth, ID band Patient awake    Reviewed: Allergy & Precautions, NPO status , Patient's Chart, lab work & pertinent test results  Airway Mallampati: II       Dental  (+) Teeth Intact   Pulmonary neg pulmonary ROS,    breath sounds clear to auscultation       Cardiovascular + dysrhythmias  Rhythm:Regular Rate:Normal     Neuro/Psych negative neurological ROS     GI/Hepatic Neg liver ROS, GERD  ,  Endo/Other  negative endocrine ROS  Renal/GU negative Renal ROS     Musculoskeletal   Abdominal Normal abdominal exam  (+)   Peds  Hematology   Anesthesia Other Findings   Reproductive/Obstetrics                             Anesthesia Physical Anesthesia Plan  ASA: II  Anesthesia Plan: General   Post-op Pain Management:    Induction: Intravenous  Airway Management Planned: Oral ETT  Additional Equipment:   Intra-op Plan:   Post-operative Plan: Extubation in OR  Informed Consent: I have reviewed the patients History and Physical, chart, labs and discussed the procedure including the risks, benefits and alternatives for the proposed anesthesia with the patient or authorized representative who has indicated his/her understanding and acceptance.     Plan Discussed with: Anesthesiologist  Anesthesia Plan Comments:         Anesthesia Quick Evaluation

## 2015-07-26 NOTE — Op Note (Signed)
OPERATIVE REPORT  PREOPERATIVE DIAGNOSIS:  Chronic cholecystitis cholelithiasis  POSTOPERATIVE DIAGNOSIS: Chronic cholecystitis cholelithiasis  PROCEDURE: Laparoscopic cholecystectomy with cholangiogram  ANESTHESIA: General  SURGEON: Rochel Brome M.D.  INDICATIONS: She had an episode of epigastric pain rated at a 6 and ultrasound findings of sludge and small stones in the gallbladder. Surgery was recommended for definitive treatment    With the patient on the operating table in the supine position under general endotracheal anesthesia the abdomen was prepared with ChloraPrep solution and draped in a sterile manner. A short incision was made in the inferior aspect of the umbilicus and carried down to the deep fascia which was grasped with a laryngeal hook. A Veress needle was inserted aspirated and irrigated with a saline solution. The peritoneal cavity was insufflated with carbon dioxide. The Veress needle was removed. The 10 mm cannula was inserted. The 10 mm 0 laparoscope was inserted to view the peritoneal cavity.  Another incision was made in the epigastrium slightly to the right of the midline to introduce an 11 mm cannula. 2 incisions were made in the lateral aspect of the right upper quadrant to introduce 2   5 mm cannulas. Initial survey revealed no abnormalities. The liver had a smooth surface. The gallbladder was retracted towards the right shoulder.  The gallbladder neck was retracted inferiorly and laterally.  The porta hepatis was identified. The gallbladder was mobilized with incision of the visceral peritoneum. The cystic duct was dissected free from surrounding structures. The cystic artery was dissected free from surrounding structures. A critical view of safety was demonstrated  An Endo Clip was placed across the cystic duct adjacent to the gallbladder neck. An incision was made in the cystic duct to introduce a Reddick catheter. The cholangiogram was done with injection of  half-strength Conray 60 dye. This demonstrated the bile ducts and flow of dye into the duodenum. No retained stones were identified. The cholangiogram appeared normal. The Reddick catheter was removed. The cystic duct was doubly ligated with endoclips and divided. The cystic artery was controlled with double endoclips and divided. The gallbladder was dissected free from the liver with use of hook and cautery and blunt dissection. Bleeding was minimal and hemostasis was intact. The gallbladder was delivered up through the infraumbilical incision opened and suctioned.  . There was some sludge within the gallbladder. The gallbladder was submitted in formalin for routine pathology. The cannulas were removed allowing carbon dioxide to escape from the peritoneal cavity. A fascial defect at the umbilical incision was closed with a inverted 0 Maxon figure-of-eight suture. The skin incisions were closed with interrupted 5-0 chromic subcutaneous suture benzoin and Steri-Strips. Gauze dressings were applied with paper tape.  The patient appeared to be in satisfactory condition and was prepared for transfer to the recovery room  [place].D.

## 2015-07-26 NOTE — Anesthesia Postprocedure Evaluation (Signed)
Anesthesia Post Note  Patient: Debbie Floyd  Procedure(s) Performed: Procedure(s) (LRB): LAPAROSCOPIC CHOLECYSTECTOMY WITH INTRAOPERATIVE CHOLANGIOGRAM (N/A)  Patient location during evaluation: PACU Anesthesia Type: General Level of consciousness: awake Pain management: pain level controlled Vital Signs Assessment: post-procedure vital signs reviewed and stable Respiratory status: spontaneous breathing Cardiovascular status: stable Anesthetic complications: no    Last Vitals:  Filed Vitals:   07/26/15 1218 07/26/15 1228  BP: 102/59 111/52  Pulse: 55 60  Temp: 36.1 C 36.5 C  Resp: 12 16    Last Pain:  Filed Vitals:   07/26/15 1232  PainSc: 2                  VAN STAVEREN,Grayland Daisey

## 2015-07-26 NOTE — Transfer of Care (Signed)
Immediate Anesthesia Transfer of Care Note  Patient: Debbie Floyd  Procedure(s) Performed: Procedure(s): LAPAROSCOPIC CHOLECYSTECTOMY WITH INTRAOPERATIVE CHOLANGIOGRAM (N/A)  Patient Location: PACU  Anesthesia Type:General  Level of Consciousness: awake, alert  and oriented  Airway & Oxygen Therapy: Patient Spontanous Breathing and Patient connected to face mask oxygen  Post-op Assessment: Report given to RN and Post -op Vital signs reviewed and stable  Post vital signs: Reviewed and stable  Last Vitals: 11:32 -  100% 78 hr 12 resp 123/64 bp  Filed Vitals:   07/26/15 0849  BP: 146/77  Pulse: 74  Temp: 36.9 C  Resp: 16    Last Pain: There were no vitals filed for this visit.       Complications: No apparent anesthesia complications

## 2015-07-26 NOTE — Discharge Instructions (Signed)
Take Tylenol or Norco or tramadol as needed for pain.  Remove dressings on Wednesday. May shower Thursday.  Steri-Strips will likely stick for a week and eventually start peeling off.  Gradually increase activities as tolerated. Avoid straining and heavy lifting for the first week after surgery.    AMBULATORY SURGERY  DISCHARGE INSTRUCTIONS   1) The drugs that you were given will stay in your system until tomorrow so for the next 24 hours you should not:  A) Drive an automobile B) Make any legal decisions C) Drink any alcoholic beverage   2) You may resume regular meals tomorrow.  Today it is better to start with liquids and gradually work up to solid foods.  You may eat anything you prefer, but it is better to start with liquids, then soup and crackers, and gradually work up to solid foods.   3) Please notify your doctor immediately if you have any unusual bleeding, trouble breathing, redness and pain at the surgery site, drainage, fever, or pain not relieved by medication.    4) Additional Instructions:        Please contact your physician with any problems or Same Day Surgery at [phone], Monday through Friday 6 am to 4 pm, or [place] at [facility] number at [phone].

## 2015-07-26 NOTE — Anesthesia Procedure Notes (Signed)
Procedure Name: Intubation Date/Time: 07/26/2015 9:51 AM Performed by: Delaney Meigs Pre-anesthesia Checklist: Patient identified, Emergency Drugs available, Suction available, Patient being monitored and Timeout performed Patient Re-evaluated:Patient Re-evaluated prior to inductionOxygen Delivery Method: Circle system utilized Preoxygenation: Pre-oxygenation with 100% oxygen Intubation Type: IV induction Ventilation: Mask ventilation without difficulty Laryngoscope Size: Mac and 3 Grade View: Grade II Tube type: Oral Tube size: 7.0 mm Number of attempts: 1 Airway Equipment and Method: Stylet Placement Confirmation: ETT inserted through vocal cords under direct vision,  positive ETCO2 and breath sounds checked- equal and bilateral Secured at: 21 cm Tube secured with: Tape Dental Injury: Teeth and Oropharynx as per pre-operative assessment

## 2015-07-26 NOTE — H&P (Signed)
  She reports no change in condition since the day of the office examination.  Lab work was noted.  I discussed the plan for laparoscopic cholecystectomy.

## 2015-09-08 ENCOUNTER — Other Ambulatory Visit: Payer: Self-pay | Admitting: Oncology

## 2015-09-26 LAB — SURGICAL PATHOLOGY

## 2015-10-11 ENCOUNTER — Other Ambulatory Visit: Payer: Self-pay | Admitting: *Deleted

## 2015-10-11 MED ORDER — CALCIUM + D3 600-200 MG-UNIT PO TABS: 1.0000 | ORAL_TABLET | Freq: Two times a day (BID) | ORAL | 4 refills | Status: DC

## 2015-10-25 ENCOUNTER — Ambulatory Visit: Payer: Federal, State, Local not specified - PPO | Admitting: Oncology

## 2015-10-25 ENCOUNTER — Other Ambulatory Visit: Payer: Federal, State, Local not specified - PPO

## 2015-11-01 ENCOUNTER — Ambulatory Visit: Payer: Federal, State, Local not specified - PPO | Admitting: Oncology

## 2015-11-01 ENCOUNTER — Telehealth: Payer: Self-pay | Admitting: *Deleted

## 2015-11-01 ENCOUNTER — Other Ambulatory Visit: Payer: Federal, State, Local not specified - PPO

## 2015-11-01 NOTE — Telephone Encounter (Signed)
Lab cancelled per VO Dr Grayland Ormond. Patient informed

## 2015-11-01 NOTE — Telephone Encounter (Signed)
Has lab/ md appt Thursday. Asking if she needs to have labs repeated as she just had them drawn at PCP office 8/25. Please advise.

## 2015-11-02 NOTE — Progress Notes (Signed)
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   INTERVAL HISTORY: Patient returns to clinic today for routine six-month follow-up of the above stated breast cancer. She currently feels well and is asymptomatic. She is tolerating letrozole well without significant side effects.  She has no neurologic complaints. She denies any recent fevers or illnesses. She has a good appetite and denies weight loss. She has no chest pain or shortness of breath. She denies any nausea, vomiting, constipation, or diarrhea. She has no urinary complaints. Patient feels at her baseline and offers no specific complaints today.   REVIEW OF SYSTEMS:   Review of Systems  Constitutional: Negative.  Negative for fever, malaise/fatigue and weight loss.  Respiratory: Negative.  Negative for cough and shortness of breath.   Cardiovascular: Negative.  Negative for chest pain.  Gastrointestinal: Negative.  Negative for abdominal pain.  Genitourinary: Negative.   Musculoskeletal: Negative.   Neurological: Negative.  Negative for sensory change and weakness.  Psychiatric/Behavioral: Negative.  The patient is not nervous/anxious.     As per HPI. Otherwise, a complete review of systems is negatve.  ONCOLOGY HISTORY: Oncology History   60 year old female with pathologic stage Ib (T1 B. N0 M0) invasive mammary carcinoma status post wide local excision and sentinel node biopsy ER/PR positive HER-2/neu negative by fish 2,Oncotype Dx  score is 6% (low) 3.  Finished radiation therapy may 2014 4,  Femara of  June of 2014 5.patient had bilateral oophorectomy inNovember of 2014     Breast cancer of upper-outer quadrant of left  female breast [facility])    PAST MEDICAL HISTORY: Past Medical History:  Diagnosis Date  . Arthritis    RIGHT KNEE  . Breast cancer ([place]) 2014   lumpectomy lt  . Cancer of left breast ([place]) 09/14/2014  . Dysrhythmia    PALPITATIONS-WELL CONTROLLED ON METOPROLOL (07-19-15)  . GERD (gastroesophageal reflux disease)     PAST SURGICAL HISTORY: Past Surgical History:  Procedure Laterality Date  . BREAST LUMPECTOMY Left 2014   with radiation  . CHOLECYSTECTOMY N/A 07/26/2015   Procedure: LAPAROSCOPIC CHOLECYSTECTOMY WITH INTRAOPERATIVE CHOLANGIOGRAM;  Surgeon: Leonie Green, MD;  Location: [facility] ORS;  Service: General;  Laterality: N/A;  . OOPHORECTOMY Bilateral 2014    FAMILY HISTORY Family History  Problem Relation Age of Onset  . Breast cancer Paternal Aunt   . Ovarian cancer Maternal Grandmother 80    GYNECOLOGIC HISTORY:  No LMP recorded. Patient is postmenopausal.     ADVANCED DIRECTIVES:    HEALTH MAINTENANCE: Social History  Substance Use Topics  . Smoking status: Never Smoker  . Smokeless tobacco: Not on file  . Alcohol use No     Colonoscopy:  PAP:  Bone density:  Mammogram: 04/2015  Allergies  Allergen Reactions  . Adhesive [Tape] Rash    IF LEFT ON FOR LONG PERIOD OF TIME  . Penicillins Other (See Comments) and Rash    febrile  . Sulfa Antibiotics Other (See Comments) and Rash    febrile    Current Outpatient Prescriptions  Medication Sig Dispense Refill  . atorvastatin (LIPITOR) 20 MG tablet TAKE 1 TABLET BY MOUTH AT BEDTIME    . Calcium Carb-Cholecalciferol (CALCIUM + D3) 600-200 MG-UNIT TABS  Take 1 tablet by mouth 2 (two) times daily. 180 tablet 4  . doxycycline (PERIOSTAT) 20 MG tablet Take 20 mg by mouth every other day.     . letrozole (FEMARA) 2.5 MG tablet TAKE 1 TABLET BY MOUTH EVERY DAY 30 tablet 6  . mesalamine (LIALDA) 1.2 G EC tablet TAKE 1 TABLET BY MOUTH BID    . metoprolol succinate (TOPROL-XL) 50 MG 24 hr tablet Take 50 mg by  mouth every morning.     Marland Kitchen omeprazole (PRILOSEC) 20 MG capsule Take 20 mg by mouth 2 (two) times daily.     Marland Kitchen oxybutynin (DITROPAN) 5 MG tablet Take 5 mg by mouth every morning.     . tobramycin-dexamethasone (TOBRADEX) ophthalmic solution Reported on 07/26/2015  1   No current facility-administered medications for this visit.     OBJECTIVE: BP (!) 142/73 (BP Location: Right Arm, Patient Position: Sitting)   Pulse (!) 102   Temp 97.6 F (36.4 C) (Tympanic)   Resp 18   Wt 157 lb 8.3 oz (71.5 kg)   BMI 27.04 kg/m    Body mass index is 27.04 kg/m.    ECOG FS:0 - Asymptomatic  General: Well-developed, well-nourished, no acute distress. Eyes: Pink conjunctiva, anicteric sclera. Breasts: Bilateral breast and axilla without lumps or masses. Lungs: Clear to auscultation bilaterally. Heart: Regular rate and rhythm. No rubs, murmurs, or gallops. Abdomen: Soft, nontender, nondistended. No organomegaly noted, normoactive bowel sounds. Musculoskeletal: No edema, cyanosis, or clubbing. Neuro: Alert, answering all questions appropriately. Cranial nerves grossly intact. Skin: No rashes or petechiae noted. Psych: Normal affect.  LAB RESULTS:    STUDIES: No results found.  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  PLAN:    1. 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 Continue letrozole for 5 years completing in June 2019. Patient's most recent mammogram on April 22, 2015 was reported as BI-RADS 2, repeat in one year. Return to clinic in 6 months for routine evaluation. 2. Postmenopausal: Patient's most recent bone mineral density completed in February 2016 reported a T score of -0.7 which is considered normal repeat in February 2018. Patient has been instructed to continue her calcium and vitamin D supplementation.    Patient expressed understanding and was in agreement with this plan. She also understands that She can call clinic at any time with any questions, concerns, or complaints.    Lloyd Huger, MD   11/08/2015 11:15 PM

## 2015-11-03 ENCOUNTER — Inpatient Hospital Stay: Payer: Federal, State, Local not specified - PPO | Attending: Oncology | Admitting: Oncology

## 2015-11-03 ENCOUNTER — Encounter (INDEPENDENT_AMBULATORY_CARE_PROVIDER_SITE_OTHER): Payer: Self-pay

## 2015-11-03 ENCOUNTER — Inpatient Hospital Stay: Payer: Federal, State, Local not specified - PPO

## 2015-11-03 VITALS — BP 142/73 | HR 102 | Temp 97.6°F | Resp 18 | Wt 157.5 lb

## 2015-11-03 DIAGNOSIS — Z90722 Acquired absence of ovaries, bilateral: Secondary | ICD-10-CM | POA: Diagnosis not present

## 2015-11-03 DIAGNOSIS — C50412 Malignant neoplasm of upper-outer quadrant of left female breast: Secondary | ICD-10-CM | POA: Insufficient documentation

## 2015-11-03 DIAGNOSIS — R002 Palpitations: Secondary | ICD-10-CM | POA: Insufficient documentation

## 2015-11-03 DIAGNOSIS — Z79899 Other long term (current) drug therapy: Secondary | ICD-10-CM | POA: Diagnosis not present

## 2015-11-03 DIAGNOSIS — Z79811 Long term (current) use of aromatase inhibitors: Secondary | ICD-10-CM | POA: Diagnosis not present

## 2015-11-03 DIAGNOSIS — Z8041 Family history of malignant neoplasm of ovary: Secondary | ICD-10-CM | POA: Insufficient documentation

## 2015-11-03 DIAGNOSIS — Z803 Family history of malignant neoplasm of breast: Secondary | ICD-10-CM | POA: Insufficient documentation

## 2015-11-03 DIAGNOSIS — Z170 Estrogen receptor positive status [ER+]: Secondary | ICD-10-CM | POA: Insufficient documentation

## 2015-11-03 DIAGNOSIS — M129 Arthropathy, unspecified: Secondary | ICD-10-CM | POA: Diagnosis not present

## 2015-11-03 DIAGNOSIS — K219 Gastro-esophageal reflux disease without esophagitis: Secondary | ICD-10-CM | POA: Diagnosis not present

## 2015-11-03 DIAGNOSIS — Z923 Personal history of irradiation: Secondary | ICD-10-CM | POA: Diagnosis not present

## 2015-11-03 NOTE — Progress Notes (Signed)
States is feeling well. Offers no complaints. 

## 2015-11-23 ENCOUNTER — Other Ambulatory Visit
Admission: RE | Admit: 2015-11-23 | Discharge: 2015-11-23 | Disposition: A | Discharge status: Home / Self Care | Payer: Federal, State, Local not specified - PPO | Source: Ambulatory Visit | Attending: Nurse Practitioner | Admitting: Nurse Practitioner

## 2015-11-23 DIAGNOSIS — K51811 Other ulcerative colitis with rectal bleeding: Secondary | ICD-10-CM | POA: Diagnosis not present

## 2015-11-23 LAB — C DIFFICILE QUICK SCREEN W PCR REFLEX
C Diff antigen: NEGATIVE
C Diff interpretation: NOT DETECTED
C Diff toxin: NEGATIVE

## 2016-02-13 ENCOUNTER — Other Ambulatory Visit: Payer: Self-pay | Admitting: *Deleted

## 2016-02-13 MED ORDER — LETROZOLE 2.5 MG PO TABS: 2.5000 mg | ORAL_TABLET | Freq: Every day | ORAL | 6 refills | Status: DC

## 2016-04-12 ENCOUNTER — Ambulatory Visit
Admission: RE | Admit: 2016-04-12 | Discharge: 2016-04-12 | Disposition: A | Discharge status: Home / Self Care | Payer: Federal, State, Local not specified - PPO | Source: Ambulatory Visit | Attending: Oncology | Admitting: Oncology

## 2016-04-12 DIAGNOSIS — C50412 Malignant neoplasm of upper-outer quadrant of left female breast: Secondary | ICD-10-CM

## 2016-04-12 DIAGNOSIS — Z780 Asymptomatic menopausal state: Secondary | ICD-10-CM | POA: Diagnosis not present

## 2016-04-18 ENCOUNTER — Inpatient Hospital Stay: Payer: Federal, State, Local not specified - PPO | Admitting: Oncology

## 2016-04-30 NOTE — Progress Notes (Deleted)
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   INTERVAL HISTORY: Patient returns to clinic today for routine six-month follow-up of the above stated breast cancer. She currently feels well and is asymptomatic. She is tolerating letrozole well without significant side effects.  She has no neurologic complaints. She denies any recent fevers or illnesses. She has a good appetite and denies weight loss. She has no chest pain or shortness of breath. She denies any nausea, vomiting, constipation, or diarrhea. She has no urinary complaints. Patient feels at her baseline and offers no specific complaints today.   REVIEW OF SYSTEMS:   Review of Systems  Constitutional: Negative.  Negative for fever, malaise/fatigue and weight loss.  Respiratory: Negative.  Negative for cough and shortness of breath.   Cardiovascular: Negative.  Negative for chest pain.  Gastrointestinal: Negative.  Negative for abdominal pain.  Genitourinary: Negative.   Musculoskeletal: Negative.   Neurological: Negative.  Negative for sensory change and weakness.  Psychiatric/Behavioral: Negative.  The patient is not nervous/anxious.     As per HPI. Otherwise, a complete review of systems is negatve.  ONCOLOGY HISTORY: Oncology History   61 year old female with pathologic stage Ib (T1 B. N0 M0) invasive mammary carcinoma status post wide local excision and sentinel node biopsy ER/PR positive HER-2/neu negative by fish 2,Oncotype Dx  score is 6% (low) 3.  Finished radiation therapy may 2014 4,  Femara of  June of 2014 5.patient had bilateral oophorectomy inNovember of 2014     Breast cancer of upper-outer quadrant of left  female breast [facility])    PAST MEDICAL HISTORY: Past Medical History:  Diagnosis Date  . Arthritis    RIGHT KNEE  . Breast cancer ([place]) 2014   lumpectomy lt  . Cancer of left breast ([place]) 09/14/2014  . Dysrhythmia    PALPITATIONS-WELL CONTROLLED ON METOPROLOL (07-19-15)  . GERD (gastroesophageal reflux disease)     PAST SURGICAL HISTORY: Past Surgical History:  Procedure Laterality Date  . BREAST LUMPECTOMY Left 2014   with radiation  . CHOLECYSTECTOMY N/A 07/26/2015   Procedure: LAPAROSCOPIC CHOLECYSTECTOMY WITH INTRAOPERATIVE CHOLANGIOGRAM;  Surgeon: Leonie Green, MD;  Location: [facility] ORS;  Service: General;  Laterality: N/A;  . OOPHORECTOMY Bilateral 2014    FAMILY HISTORY Family History  Problem Relation Age of Onset  . Breast cancer Paternal Aunt   . Ovarian cancer Maternal Grandmother 80    GYNECOLOGIC HISTORY:  No LMP recorded. Patient is postmenopausal.     ADVANCED DIRECTIVES:    HEALTH MAINTENANCE: Social History  Substance Use Topics  . Smoking status: Never Smoker  . Smokeless tobacco: Not on file  . Alcohol use No     Colonoscopy:  PAP:  Bone density:  Mammogram: 04/2015  Allergies  Allergen Reactions  . Adhesive [Tape] Rash    IF LEFT ON FOR LONG PERIOD OF TIME  . Penicillins Other (See Comments) and Rash    febrile  . Sulfa Antibiotics Other (See Comments) and Rash    febrile    Current Outpatient Prescriptions  Medication Sig Dispense Refill  . atorvastatin (LIPITOR) 20 MG tablet TAKE 1 TABLET BY MOUTH AT BEDTIME    . Calcium Carb-Cholecalciferol (CALCIUM + D3) 600-200 MG-UNIT TABS  Take 1 tablet by mouth 2 (two) times daily. 180 tablet 4  . doxycycline (PERIOSTAT) 20 MG tablet Take 20 mg by mouth every other day.     . letrozole (FEMARA) 2.5 MG tablet Take 1 tablet (2.5 mg total) by mouth daily. 30 tablet 6  . mesalamine (LIALDA) 1.2 G EC tablet TAKE 1 TABLET BY MOUTH BID    . metoprolol succinate (TOPROL-XL) 50 MG 24 hr tablet Take  50 mg by mouth every morning.     Marland Kitchen omeprazole (PRILOSEC) 20 MG capsule Take 20 mg by mouth 2 (two) times daily.     Marland Kitchen oxybutynin (DITROPAN) 5 MG tablet Take 5 mg by mouth every morning.     . tobramycin-dexamethasone (TOBRADEX) ophthalmic solution Reported on 07/26/2015  1   No current facility-administered medications for this visit.     OBJECTIVE: There were no vitals taken for this visit.   There is no height or weight on file to calculate BMI.    ECOG FS:0 - Asymptomatic  General: Well-developed, well-nourished, no acute distress. Eyes: Pink conjunctiva, anicteric sclera. Breasts: Bilateral breast and axilla without lumps or masses. Lungs: Clear to auscultation bilaterally. Heart: Regular rate and rhythm. No rubs, murmurs, or gallops. Abdomen: Soft, nontender, nondistended. No organomegaly noted, normoactive bowel sounds. Musculoskeletal: No edema, cyanosis, or clubbing. Neuro: Alert, answering all questions appropriately. Cranial nerves grossly intact. Skin: No rashes or petechiae noted. Psych: Normal affect.  LAB RESULTS:    STUDIES: No results found.  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  PLAN:    1. 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 Continue letrozole for 5 years completing in June 2019. Patient's most recent mammogram on April 22, 2015 was reported as BI-RADS 2, repeat in one year. Return to clinic in 6 months for routine evaluation. 2. Postmenopausal: Patient's most recent bone mineral density completed in February 2016 reported a T score of -0.7 which is considered normal repeat in February 2018. Patient has been instructed to continue her calcium and vitamin D supplementation.   Patient expressed understanding and was in agreement with this plan. She also understands that She  can call clinic at any time with any questions, concerns, or complaints.    Lloyd Huger, MD   04/30/2016 11:43 PM

## 2016-05-01 ENCOUNTER — Inpatient Hospital Stay: Payer: Federal, State, Local not specified - PPO | Admitting: Oncology

## 2016-05-06 NOTE — Progress Notes (Signed)
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  INTERVAL HISTORY: Patient returns to clinic today for routine six-month follow-up. She currently feels well and is asymptomatic. She is tolerating letrozole well without significant side effects.  She has no neurologic complaints. She denies any recent fevers or illnesses. She has a good appetite and denies weight loss. She has no chest pain or shortness of breath. She denies any nausea, vomiting, constipation, or diarrhea. She has no urinary complaints. Patient feels at her baseline and offers no specific complaints today.   REVIEW OF SYSTEMS:   Review of Systems  Constitutional: Negative.  Negative for fever, malaise/fatigue and weight loss.  Respiratory: Negative.  Negative for cough and shortness of breath.   Cardiovascular: Negative.  Negative for chest pain.  Gastrointestinal: Negative.  Negative for abdominal pain.  Genitourinary: Negative.   Musculoskeletal: Negative.   Neurological: Negative.  Negative for sensory change and weakness.  Psychiatric/Behavioral: Negative.  The patient is not nervous/anxious.     As per HPI. Otherwise, a complete review of systems is negative.  ONCOLOGY HISTORY: Oncology History   61 year old female with pathologic stage Ib (T1 B. N0 M0) invasive mammary carcinoma status post wide local excision and sentinel node biopsy ER/PR positive HER-2/neu negative by fish 2,Oncotype Dx  score is 6% (low) 3.  Finished radiation therapy may 2014 4,  Femara of  June of 2014 5.patient had bilateral oophorectomy inNovember of 2014     Breast cancer of upper-outer quadrant of left female breast [facility])    PAST MEDICAL  HISTORY: Past Medical History:  Diagnosis Date  . Arthritis    RIGHT KNEE  . Breast cancer ([place]) 2014   lumpectomy lt  . Cancer of left breast ([place]) 09/14/2014  . Dysrhythmia    PALPITATIONS-WELL CONTROLLED ON METOPROLOL (07-19-15)  . GERD (gastroesophageal reflux disease)     PAST SURGICAL HISTORY: Past Surgical History:  Procedure Laterality Date  . BREAST LUMPECTOMY Left 2014   with radiation  . CHOLECYSTECTOMY N/A 07/26/2015   Procedure: LAPAROSCOPIC CHOLECYSTECTOMY WITH INTRAOPERATIVE CHOLANGIOGRAM;  Surgeon: Leonie Green, MD;  Location: [facility] ORS;  Service: General;  Laterality: N/A;  . OOPHORECTOMY Bilateral 2014    FAMILY HISTORY Family History  Problem Relation Age of Onset  . Breast cancer Paternal Aunt   . Ovarian cancer Maternal Grandmother 80    GYNECOLOGIC HISTORY:  No LMP recorded. Patient is postmenopausal.     ADVANCED DIRECTIVES:    HEALTH MAINTENANCE: Social History  Substance Use Topics  . Smoking status: Never Smoker  . Smokeless tobacco: Not on file  . Alcohol use No     Colonoscopy:  PAP:  Bone density:  Mammogram: 04/2015  Allergies  Allergen Reactions  . Adhesive [Tape] Rash    IF LEFT ON FOR LONG PERIOD OF TIME  . Penicillins Other (See Comments) and Rash    febrile  . Sulfa Antibiotics Other (See Comments) and Rash    febrile    Current Outpatient Prescriptions  Medication Sig Dispense Refill  . atorvastatin (LIPITOR) 20 MG tablet TAKE 1 TABLET BY MOUTH AT BEDTIME    . Calcium Carb-Cholecalciferol (CALCIUM + D3) 600-200 MG-UNIT TABS Take 1 tablet by mouth 2 (two)  times daily. 180 tablet 4  . doxycycline (PERIOSTAT) 20 MG tablet Take 20 mg by mouth every other day.     . letrozole (FEMARA) 2.5 MG tablet Take 1 tablet (2.5 mg total) by mouth daily. 30 tablet 6  . mesalamine (LIALDA) 1.2 G EC tablet TAKE 1 TABLET BY MOUTH BID    . metoprolol succinate (TOPROL-XL) 50 MG 24 hr tablet Take 50 mg by mouth every morning.     Marland Kitchen  omeprazole (PRILOSEC) 20 MG capsule Take 20 mg by mouth 2 (two) times daily.     Marland Kitchen oxybutynin (DITROPAN) 5 MG tablet Take 5 mg by mouth every morning.     . tobramycin-dexamethasone (TOBRADEX) ophthalmic solution Reported on 07/26/2015  1   No current facility-administered medications for this visit.     OBJECTIVE: BP (!) 145/87 (BP Location: Right Arm, Patient Position: Sitting)   Pulse 90   Temp 99.2 F (37.3 C) (Tympanic)   Resp 18   Wt 165 lb 5.5 oz (75 kg)   BMI 28.38 kg/m    Body mass index is 28.38 kg/m.    ECOG FS:0 - Asymptomatic  General: Well-developed, well-nourished, no acute distress. Eyes: Pink conjunctiva, anicteric sclera. Breasts: Bilateral breast and axilla without lumps or masses. Patient requested exam be deferred today. Lungs: Clear to auscultation bilaterally. Heart: Regular rate and rhythm. No rubs, murmurs, or gallops. Abdomen: Soft, nontender, nondistended. No organomegaly noted, normoactive bowel sounds. Musculoskeletal: No edema, cyanosis, or clubbing. Neuro: Alert, answering all questions appropriately. Cranial nerves grossly intact. Skin: No rashes or petechiae noted. Psych: Normal affect.  LAB RESULTS:    STUDIES: No results found.  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  PLAN:    1. 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 Continue letrozole for 5 years completing in June 2019. Patient's most recent mammogram on April 12, 2016 was reported as BI-RADS 2, repeat in one year. Return to clinic in 6 months for routine evaluation. 2. Postmenopausal: Patient's most recent bone mineral density completed on April 12, 2016 reported a T score of -1.0 which is only slightly decreased from 2 years prior when it was -0.7.  This is still considered within normal limits. Repeat  in 2 years. Continue calcium and vitamin D supplementation.   Patient expressed understanding and was in agreement with this plan. She also understands that She can call clinic at any time with any questions, concerns, or complaints.    Lloyd Huger, MD   05/07/2016 4:29 PM

## 2016-05-07 ENCOUNTER — Inpatient Hospital Stay: Payer: Federal, State, Local not specified - PPO | Attending: Oncology | Admitting: Oncology

## 2016-05-07 VITALS — BP 145/87 | HR 90 | Temp 99.2°F | Resp 18 | Wt 165.3 lb

## 2016-05-07 DIAGNOSIS — Z170 Estrogen receptor positive status [ER+]: Secondary | ICD-10-CM | POA: Insufficient documentation

## 2016-05-07 DIAGNOSIS — Z79899 Other long term (current) drug therapy: Secondary | ICD-10-CM

## 2016-05-07 DIAGNOSIS — Z923 Personal history of irradiation: Secondary | ICD-10-CM | POA: Diagnosis not present

## 2016-05-07 DIAGNOSIS — Z79811 Long term (current) use of aromatase inhibitors: Secondary | ICD-10-CM | POA: Insufficient documentation

## 2016-05-07 DIAGNOSIS — Z803 Family history of malignant neoplasm of breast: Secondary | ICD-10-CM | POA: Diagnosis not present

## 2016-05-07 DIAGNOSIS — Z90722 Acquired absence of ovaries, bilateral: Secondary | ICD-10-CM | POA: Diagnosis not present

## 2016-05-07 DIAGNOSIS — K219 Gastro-esophageal reflux disease without esophagitis: Secondary | ICD-10-CM | POA: Diagnosis not present

## 2016-05-07 DIAGNOSIS — I499 Cardiac arrhythmia, unspecified: Secondary | ICD-10-CM

## 2016-05-07 DIAGNOSIS — C50412 Malignant neoplasm of upper-outer quadrant of left female breast: Secondary | ICD-10-CM | POA: Insufficient documentation

## 2016-05-07 DIAGNOSIS — Z9049 Acquired absence of other specified parts of digestive tract: Secondary | ICD-10-CM | POA: Insufficient documentation

## 2016-05-07 DIAGNOSIS — Z8041 Family history of malignant neoplasm of ovary: Secondary | ICD-10-CM

## 2016-08-13 ENCOUNTER — Other Ambulatory Visit: Payer: Self-pay | Admitting: *Deleted

## 2016-08-13 MED ORDER — LETROZOLE 2.5 MG PO TABS: 2.5000 mg | ORAL_TABLET | Freq: Every day | ORAL | 3 refills | Status: DC

## 2016-08-16 ENCOUNTER — Other Ambulatory Visit
Admission: RE | Admit: 2016-08-16 | Discharge: 2016-08-16 | Disposition: A | Discharge status: Home / Self Care | Payer: Federal, State, Local not specified - PPO | Source: Ambulatory Visit | Attending: Nurse Practitioner | Admitting: Nurse Practitioner

## 2016-08-16 DIAGNOSIS — K51911 Ulcerative colitis, unspecified with rectal bleeding: Secondary | ICD-10-CM | POA: Insufficient documentation

## 2016-08-16 LAB — GASTROINTESTINAL PANEL BY PCR, STOOL (REPLACES STOOL CULTURE)

## 2016-08-16 LAB — C DIFFICILE QUICK SCREEN W PCR REFLEX
C Diff antigen: NEGATIVE
C Diff interpretation: NOT DETECTED
C Diff toxin: NEGATIVE

## 2016-09-10 ENCOUNTER — Encounter: Payer: Self-pay | Admitting: Obstetrics and Gynecology

## 2016-09-10 ENCOUNTER — Ambulatory Visit (INDEPENDENT_AMBULATORY_CARE_PROVIDER_SITE_OTHER): Payer: Federal, State, Local not specified - PPO | Admitting: Obstetrics and Gynecology

## 2016-09-10 VITALS — BP 136/88 | Ht 65.0 in | Wt 162.0 lb

## 2016-09-10 DIAGNOSIS — Z01419 Encounter for gynecological examination (general) (routine) without abnormal findings: Secondary | ICD-10-CM | POA: Diagnosis not present

## 2016-09-10 DIAGNOSIS — Z124 Encounter for screening for malignant neoplasm of cervix: Secondary | ICD-10-CM | POA: Diagnosis not present

## 2016-09-10 DIAGNOSIS — Z1339 Encounter for screening examination for other mental health and behavioral disorders: Secondary | ICD-10-CM

## 2016-09-10 DIAGNOSIS — Z1389 Encounter for screening for other disorder: Secondary | ICD-10-CM

## 2016-09-10 DIAGNOSIS — Z1331 Encounter for screening for depression: Secondary | ICD-10-CM

## 2016-09-10 NOTE — Progress Notes (Signed)
Routine Annual Gynecology Examination   PCP: Derinda Late, MD  Chief Complaint  Patient presents with  . Annual Exam    History of Present Illness: Patient is a 61 y.o. G3P3003 presents for annual exam. The patient has no complaints today.   Menopausal bleeding: denies  Menopausal symptoms: vaginal dryness, difficulty with intercourse  Breast symptoms: denies, followed by oncology  Last pap smear: 1 years ago.  Result Normal, HPV negative  Last mammogram: several months ago. Breast cancer diagnosed 4 years ago.   Past Medical History:  Diagnosis Date  . Arthritis    RIGHT KNEE  . Breast cancer ([place]) 2014   lumpectomy lt  . Cancer of left breast ([place]) 09/14/2014  . Dysrhythmia    PALPITATIONS-WELL CONTROLLED ON METOPROLOL (07-19-15)  . GERD (gastroesophageal reflux disease)   . Hypercholesterolemia   . Overactive bladder     Past Surgical History:  Procedure Laterality Date  . BREAST LUMPECTOMY Left 2014   with radiation  . CHOLECYSTECTOMY N/A 07/26/2015   Procedure: LAPAROSCOPIC CHOLECYSTECTOMY WITH INTRAOPERATIVE CHOLANGIOGRAM;  Surgeon: Leonie Green, MD;  Location: [facility] ORS;  Service: General;  Laterality: N/A;  . OOPHORECTOMY Bilateral 2014    Prior to Admission medications   Medication Sig Start Date End Date Taking? Authorizing Provider  atorvastatin (LIPITOR) 20 MG tablet TAKE 1 TABLET BY MOUTH AT BEDTIME 06/07/14   [provider]  Calcium Carb-Cholecalciferol (CALCIUM + D3) 600-200 MG-UNIT TABS Take 1 tablet by mouth 2 (two) times daily. 10/11/15   Lloyd Huger, MD  doxycycline (PERIOSTAT) 20 MG tablet Take 20 mg by mouth every other day.     [provider]  letrozole (FEMARA) 2.5 MG tablet Take 1 tablet (2.5 mg total) by mouth daily. 08/13/16   Lloyd Huger, MD  mesalamine (LIALDA) 1.2 G EC tablet TAKE 1 TABLET BY MOUTH BID 09/07/13   [provider]  metoprolol succinate (TOPROL-XL) 50 MG 24 hr tablet Take 50  mg by mouth every morning.     [provider]  omeprazole (PRILOSEC) 20 MG capsule Take 20 mg by mouth 2 (two) times daily.  02/16/14   [provider]  oxybutynin (DITROPAN) 5 MG tablet Take 5 mg by mouth every morning.  01/15/14   [provider]  tobramycin-dexamethasone [facility]) ophthalmic solution Reported on 07/26/2015 07/16/14   [provider]    Allergies  Allergen Reactions  . Adhesive [Tape] Rash    IF LEFT ON FOR LONG PERIOD OF TIME  . Penicillins Other (See Comments) and Rash    febrile  . Sulfa Antibiotics Other (See Comments) and Rash    febrile   Obstetric History: [id number], s/p SVD x 3  Social History   Social History  . Marital status: Married    Spouse name: N/A  . Number of children: N/A  . Years of education: N/A   Occupational History  . Not on file.   Social History Main Topics  . Smoking status: Never Smoker  . Smokeless tobacco: Never Used  . Alcohol use No  . Drug use: No  . Sexual activity: Yes    Birth control/ protection: Post-menopausal   Other Topics Concern  . Not on file   Social History Narrative  . No narrative on file    Family History  Problem Relation Age of Onset  . Breast cancer Paternal Aunt   . Ovarian cancer Maternal Grandmother 80    Review of Systems  Constitutional: Negative.  HENT: Negative.   Eyes: Negative.   Respiratory: Negative.   Cardiovascular: Negative.   Gastrointestinal: Negative.   Genitourinary: Negative.   Musculoskeletal: Negative.   Skin: Negative.   Neurological: Negative.   Psychiatric/Behavioral: Negative.      Physical Exam Vitals: BP 136/88   Ht 5' 5"  (1.651 m)   Wt 162 lb (73.5 kg)   BMI 26.96 kg/m   Physical Exam  Constitutional: She is oriented to person, place, and time. She appears well-developed and well-nourished. No distress.  Genitourinary: Vagina normal and uterus normal. Pelvic exam was performed with patient supine. There is no  rash, tenderness, lesion or injury on the right labia. There is no rash, tenderness, lesion or injury on the left labia. Right adnexum does not display mass, does not display tenderness and does not display fullness. Left adnexum does not display mass, does not display tenderness and does not display fullness. Cervix does not exhibit motion tenderness, lesion, discharge, friability or polyp.   Uterus is mobile and anteverted. Uterus is not tender, exhibiting a mass or irregular (is regular).  HENT:  Head: Normocephalic and atraumatic.  Eyes: EOM are normal. No scleral icterus.  Neck: Normal range of motion. Neck supple. No thyromegaly present.  Cardiovascular: Normal rate and regular rhythm.   Pulmonary/Chest: Effort normal and breath sounds normal. No respiratory distress. She has no wheezes. She has no rales.  Abdominal: Soft. Bowel sounds are normal. She exhibits no distension and no mass. There is no tenderness. There is no rebound and no guarding.  Musculoskeletal: Normal range of motion. She exhibits no edema.  Lymphadenopathy:    She has no cervical adenopathy.  Neurological: She is alert and oriented to person, place, and time. No cranial nerve deficit.  Skin: Skin is warm and dry. No erythema.  Psychiatric: She has a normal mood and affect. Her behavior is normal. Judgment normal.     Female chaperone present for pelvic and breast  portions of the physical exam  Results: AUDIT Questionnaire (screen for alcoholism): 1 PHQ-9: 0   Assessment and Plan:  61 y.o. G59P3003 female here for routine annual gynecologic examination  Plan: Problem List Items Addressed This Visit    None    Visit Diagnoses    Women's annual routine gynecological examination    -  Primary   Screening for depression       Screening for alcohol problem       Pap smear for cervical cancer screening       Relevant Orders   IGP, Aptima HPV, rfx 16/18,45      Screening: -- Blood pressure screen managed  by PCP -- Colonoscopy - managed by GI MD Tiffany Kocher) -- Mammogram - per oncologist -- Weight screening: normal -- Depression screening negative (PHQ-9) -- Nutrition: normal -- cholesterol screening: per PCP -- osteoporosis screening: not due -- tobacco screening: not using -- alcohol screening: AUDIT questionnaire indicates low-risk usage. -- family history of breast cancer screening: done. not at high risk. -- no evidence of domestic violence or intimate partner violence. -- STD screening: gonorrhea/chlamydia NAAT not collected per patient request. -- pap smear collected per ASCCP guidelines -- HPV vaccination series: not eligilbe  Prentice Docker, MD 09/10/2016 9:24 AM

## 2016-09-12 ENCOUNTER — Encounter: Payer: Self-pay | Admitting: Obstetrics and Gynecology

## 2016-09-12 LAB — IGP, APTIMA HPV, RFX 16/18,45
HPV Aptima: NEGATIVE
PAP SMEAR COMMENT: 0

## 2016-10-11 ENCOUNTER — Other Ambulatory Visit: Payer: Self-pay | Admitting: *Deleted

## 2016-10-11 MED ORDER — CALCIUM + D3 600-200 MG-UNIT PO TABS: 1.0000 | ORAL_TABLET | Freq: Two times a day (BID) | ORAL | 4 refills | Status: DC

## 2016-11-07 NOTE — Progress Notes (Signed)
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  INTERVAL HISTORY: Patient returns to clinic today for routine six-month follow-up. She currently feels well and is asymptomatic. She is tolerating letrozole well without significant side effects.  She has no neurologic complaints. She denies any recent fevers or illnesses. She has a good appetite and denies weight loss. She has no chest pain or shortness of breath. She denies any nausea, vomiting, constipation, or diarrhea. She has no urinary complaints. Patient feels at her baseline and offers no specific complaints today.   REVIEW OF SYSTEMS:   Review of Systems  Constitutional: Negative.  Negative for fever, malaise/fatigue and weight loss.  Respiratory: Negative.  Negative for cough and shortness of breath.   Cardiovascular: Negative.  Negative for chest pain and leg swelling.  Gastrointestinal: Negative.  Negative for abdominal pain.  Genitourinary: Negative.   Musculoskeletal: Negative.   Skin: Negative.  Negative for rash.  Neurological: Negative.  Negative for sensory change and weakness.  Psychiatric/Behavioral: Negative.  The patient is not nervous/anxious.     As per HPI. Otherwise, a complete review of systems is negative.  ONCOLOGY HISTORY: Oncology History   61 year old female with pathologic stage Ib (T1 B. N0 M0) invasive mammary carcinoma status post wide local excision and sentinel node biopsy ER/PR positive HER-2/neu negative by fish 2,Oncotype Dx  score is 6% (low) 3.  Finished radiation therapy may 2014 4,  Femara of  June of 2014 5.patient had bilateral oophorectomy inNovember of 2014     Breast cancer of upper-outer  quadrant of left female breast [facility])    PAST MEDICAL HISTORY: Past Medical History:  Diagnosis Date  . Arthritis    RIGHT KNEE  . Breast cancer ([place]) 2014   lumpectomy lt  . Cancer of left breast ([place]) 09/14/2014  . Dysrhythmia    PALPITATIONS-WELL CONTROLLED ON METOPROLOL (07-19-15)  . GERD (gastroesophageal reflux disease)   . Hypercholesterolemia   . Overactive bladder     PAST SURGICAL HISTORY: Past Surgical History:  Procedure Laterality Date  . BREAST LUMPECTOMY Left 2014   with radiation  . CHOLECYSTECTOMY N/A 07/26/2015   Procedure: LAPAROSCOPIC CHOLECYSTECTOMY WITH INTRAOPERATIVE CHOLANGIOGRAM;  Surgeon: Leonie Green, MD;  Location: [facility] ORS;  Service: General;  Laterality: N/A;  . OOPHORECTOMY Bilateral 2014    FAMILY HISTORY Family History  Problem Relation Age of Onset  . Breast cancer Paternal Aunt   . Ovarian cancer Maternal Grandmother 80    GYNECOLOGIC HISTORY:  No LMP recorded. Patient is postmenopausal.     ADVANCED DIRECTIVES:    HEALTH MAINTENANCE: Social History  Substance Use Topics  . Smoking status: Never Smoker  . Smokeless tobacco: Never Used  . Alcohol use No     Colonoscopy:  PAP:  Bone density:  Mammogram: 04/2015  Allergies  Allergen Reactions  . Adhesive [Tape] Rash    IF LEFT ON FOR LONG PERIOD OF TIME  . Penicillins Other (See Comments) and Rash    febrile  . Sulfa Antibiotics Other (See Comments) and Rash    febrile    Current Outpatient Prescriptions  Medication Sig Dispense Refill  . atorvastatin (LIPITOR) 10 MG tablet Take 5 mg by mouth daily.    Marland Kitchen  Calcium Carb-Cholecalciferol (CALCIUM + D3) 600-200 MG-UNIT TABS Take 1 tablet by mouth 2 (two) times daily. 180 tablet 4  . doxycycline (PERIOSTAT) 20 MG tablet Take 20 mg by mouth every other day. Monday, Wednesday, Friday    . letrozole (FEMARA) 2.5 MG tablet Take 1 tablet (2.5 mg total) by mouth daily. 90 tablet 3  . mesalamine (LIALDA) 1.2 G EC tablet TAKE 1  TABLET BY MOUTH BID    . metoprolol succinate (TOPROL-XL) 50 MG 24 hr tablet Take 50 mg by mouth every morning.     Marland Kitchen omeprazole (PRILOSEC) 20 MG capsule Take 20 mg by mouth 2 (two) times daily.     Marland Kitchen oxybutynin (DITROPAN) 5 MG tablet Take 5 mg by mouth every morning.     . tobramycin-dexamethasone (TOBRADEX) ophthalmic solution Reported on 07/26/2015  1   No current facility-administered medications for this visit.     OBJECTIVE: BP 136/81 (BP Location: Right Arm, Patient Position: Sitting)   Pulse 71   Temp 99.3 F (37.4 C) (Tympanic)   Resp 18   Wt 160 lb 12.8 oz (72.9 kg)   BMI 26.76 kg/m    Body mass index is 26.76 kg/m.    ECOG FS:0 - Asymptomatic  General: Well-developed, well-nourished, no acute distress. Eyes: Pink conjunctiva, anicteric sclera. Breasts: Bilateral breast and axilla without lumps or masses. Patient requested exam be deferred today. Lungs: Clear to auscultation bilaterally. Heart: Regular rate and rhythm. No rubs, murmurs, or gallops. Abdomen: Soft, nontender, nondistended. No organomegaly noted, normoactive bowel sounds. Musculoskeletal: No edema, cyanosis, or clubbing. Neuro: Alert, answering all questions appropriately. Cranial nerves grossly intact. Skin: No rashes or petechiae noted. Psych: Normal affect.  LAB RESULTS:    STUDIES: No results found.  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  PLAN:    1. 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 Continue letrozole for 5 years completing in June 2019. Patient's most recent mammogram on April 12, 2016 was reported as BI-RADS 2, repeat in February 2019. Return to clinic in 6 months for routine evaluation. 2. Postmenopausal: Patient's most recent bone mineral density completed on April 12, 2016 reported a T score of -1.0  which is only slightly decreased from 2 years prior when it was -0.7.  This is still considered within normal limits. Repeat in February 2020. Continue calcium and vitamin D supplementation.   Approximately 20 minutes was spent in discussion of which greater than 50% was consultation.  Patient expressed understanding and was in agreement with this plan. She also understands that She can call clinic at any time with any questions, concerns, or complaints.    Lloyd Huger, MD   11/08/2016 4:42 PM

## 2016-11-08 ENCOUNTER — Inpatient Hospital Stay: Payer: Federal, State, Local not specified - PPO | Attending: Oncology | Admitting: Oncology

## 2016-11-08 VITALS — BP 136/81 | HR 71 | Temp 99.3°F | Resp 18 | Wt 160.8 lb

## 2016-11-08 DIAGNOSIS — Z923 Personal history of irradiation: Secondary | ICD-10-CM | POA: Diagnosis not present

## 2016-11-08 DIAGNOSIS — K219 Gastro-esophageal reflux disease without esophagitis: Secondary | ICD-10-CM | POA: Diagnosis not present

## 2016-11-08 DIAGNOSIS — Z79811 Long term (current) use of aromatase inhibitors: Secondary | ICD-10-CM | POA: Diagnosis not present

## 2016-11-08 DIAGNOSIS — E7800 Pure hypercholesterolemia, unspecified: Secondary | ICD-10-CM | POA: Diagnosis not present

## 2016-11-08 DIAGNOSIS — C50412 Malignant neoplasm of upper-outer quadrant of left female breast: Secondary | ICD-10-CM | POA: Diagnosis not present

## 2016-11-08 DIAGNOSIS — Z8041 Family history of malignant neoplasm of ovary: Secondary | ICD-10-CM | POA: Diagnosis not present

## 2016-11-08 DIAGNOSIS — Z170 Estrogen receptor positive status [ER+]: Secondary | ICD-10-CM | POA: Diagnosis not present

## 2016-11-08 DIAGNOSIS — N3281 Overactive bladder: Secondary | ICD-10-CM | POA: Insufficient documentation

## 2016-11-08 DIAGNOSIS — Z79899 Other long term (current) drug therapy: Secondary | ICD-10-CM | POA: Insufficient documentation

## 2016-11-08 DIAGNOSIS — Z803 Family history of malignant neoplasm of breast: Secondary | ICD-10-CM | POA: Insufficient documentation

## 2017-04-25 ENCOUNTER — Ambulatory Visit
Admission: RE | Admit: 2017-04-25 | Discharge: 2017-04-25 | Disposition: A | Discharge status: Home / Self Care | Payer: Federal, State, Local not specified - PPO | Source: Ambulatory Visit | Attending: Oncology | Admitting: Oncology

## 2017-04-25 DIAGNOSIS — Z08 Encounter for follow-up examination after completed treatment for malignant neoplasm: Secondary | ICD-10-CM | POA: Insufficient documentation

## 2017-04-25 DIAGNOSIS — Z170 Estrogen receptor positive status [ER+]: Secondary | ICD-10-CM | POA: Diagnosis present

## 2017-04-25 DIAGNOSIS — C50412 Malignant neoplasm of upper-outer quadrant of left female breast: Secondary | ICD-10-CM

## 2017-04-25 DIAGNOSIS — Z853 Personal history of malignant neoplasm of breast: Secondary | ICD-10-CM | POA: Insufficient documentation

## 2017-05-04 NOTE — Progress Notes (Signed)
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  INTERVAL HISTORY: Patient returns to clinic today for routine six-month follow-up. She has some mild left neck pain, but otherwise feels well and is asymptomatic. She continues to tolerate letrozole well without significant side effects. She has no neurologic complaints. She denies any recent fevers or illnesses. She has a good appetite and denies weight loss. She has no chest pain or shortness of breath. She denies any nausea, vomiting, constipation, or diarrhea. She has no urinary complaints. Patient feels at her baseline and offers no further specific complaints today.   REVIEW OF SYSTEMS:   Review of Systems  Constitutional: Negative.  Negative for fever, malaise/fatigue and weight loss.  Respiratory: Negative.  Negative for cough and shortness of breath.   Cardiovascular: Negative.  Negative for chest pain and leg swelling.  Gastrointestinal: Negative.  Negative for abdominal pain.  Genitourinary: Negative.   Musculoskeletal: Positive for neck pain.  Skin: Negative.  Negative for rash.  Neurological: Negative.  Negative for sensory change and weakness.  Psychiatric/Behavioral: Negative.  The patient is not nervous/anxious.     As per HPI. Otherwise, a complete review of systems is negative.  ONCOLOGY HISTORY: Oncology History   62 year old female with pathologic stage Ib (T1 B. N0 M0) invasive mammary carcinoma status post wide local excision and sentinel node biopsy ER/PR positive HER-2/neu negative by fish 2,Oncotype Dx  score is 6% (low) 3.  Finished radiation therapy may 2014 4,  Femara of  June of 2014 5.patient had bilateral  oophorectomy inNovember of 2014     Breast cancer of upper-outer quadrant of left female breast [facility])    PAST MEDICAL HISTORY: Past Medical History:  Diagnosis Date  . Arthritis    RIGHT KNEE  . Breast cancer ([place]) 2014   lumpectomy lt  . Cancer of left breast ([place]) 09/14/2014  . Dysrhythmia    PALPITATIONS-WELL CONTROLLED ON METOPROLOL (07-19-15)  . GERD (gastroesophageal reflux disease)   . Hypercholesterolemia   . Overactive bladder     PAST SURGICAL HISTORY: Past Surgical History:  Procedure Laterality Date  . 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 BREAST LUMPECTOMY Left 2014   with radiation  . CHOLECYSTECTOMY N/A 07/26/2015   Procedure: LAPAROSCOPIC CHOLECYSTECTOMY WITH INTRAOPERATIVE CHOLANGIOGRAM;  Surgeon: Leonie Green, MD;  Location: [facility] ORS;  Service: General;  Laterality: N/A;  . OOPHORECTOMY Bilateral 2014    FAMILY HISTORY Family History  Problem Relation Age of Onset  . Breast cancer Paternal Aunt   . Ovarian cancer Maternal Grandmother 80  . Breast cancer Cousin   . Breast cancer Cousin     GYNECOLOGIC HISTORY:  No LMP recorded. Patient is postmenopausal.     ADVANCED DIRECTIVES:    HEALTH MAINTENANCE: Social History   Tobacco Use  . Smoking status: Never Smoker  . Smokeless tobacco: Never Used  Substance Use Topics  . Alcohol use: No  . Drug use: No     Colonoscopy:  PAP:  Bone density:  Mammogram: 04/2015  Allergies  Allergen Reactions  . Adhesive [Tape] Rash    IF LEFT ON FOR LONG PERIOD OF TIME  . Penicillins Other (See Comments) and Rash  febrile  . Sulfa Antibiotics Other (See Comments) and Rash    febrile    Current Outpatient Medications  Medication Sig Dispense Refill  . atorvastatin (LIPITOR) 10 MG tablet Take 5 mg by mouth daily.    . Calcium Carb-Cholecalciferol (CALCIUM + D3) 600-200 MG-UNIT TABS Take 1 tablet by mouth 2 (two) times daily. 180 tablet 4  . doxycycline (PERIOSTAT) 20 MG  tablet Take 20 mg by mouth every other day. Monday, Wednesday, Friday    . letrozole (FEMARA) 2.5 MG tablet Take 1 tablet (2.5 mg total) by mouth daily. 90 tablet 3  . mesalamine (LIALDA) 1.2 G EC tablet TAKE 1 TABLET BY MOUTH BID    . metoprolol succinate (TOPROL-XL) 50 MG 24 hr tablet Take 50 mg by mouth every morning.     Marland Kitchen omeprazole (PRILOSEC) 20 MG capsule Take 20 mg by mouth 2 (two) times daily.     Marland Kitchen oxybutynin (DITROPAN) 5 MG tablet Take 5 mg by mouth every morning.     . tobramycin-dexamethasone (TOBRADEX) ophthalmic solution Reported on 07/26/2015  1   No current facility-administered medications for this visit.     OBJECTIVE: BP (!) 146/98   Pulse 99   Temp 97.9 F (36.6 C)   Resp 16   Ht _0  (1.651 m)   Wt 163 lb 6.4 oz (74.1 kg)   BMI 27.19 kg/m    Body mass index is 27.19 kg/m.    ECOG FS:0 - Asymptomatic  General: Well-developed, well-nourished, no acute distress. Eyes: Pink conjunctiva, anicteric sclera.   Neck: No palpable masses or adenopathy. Breasts: Bilateral breast and axilla without lumps or masses. Patient requested exam be deferred today. Lungs: Clear to auscultation bilaterally. Heart: Regular rate and rhythm. No rubs, murmurs, or gallops. Abdomen: Soft, nontender, nondistended. No organomegaly noted, normoactive bowel sounds. Musculoskeletal: No edema, cyanosis, or clubbing. Neuro: Alert, answering all questions appropriately. Cranial nerves grossly intact. Skin: No rashes or petechiae noted. Psych: Normal affect.  LAB RESULTS:    STUDIES: No results found.  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  PLAN:    1. 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 Continue letrozole for 5 years completing in June 2019. Patient's most recent mammogram on April 12, 2016 was  reported as BI-RADS 2, repeat in February 2019. Return to clinic in 1 year for routine evaluation. 2. Postmenopausal: Patient's most recent bone mineral density completed on April 12, 2016 reported a T score of -1.0 which is only slightly decreased from 2 years prior when it was -0.7.  This is still considered within normal limits. Repeat in February 2020. Continue calcium and vitamin D supplementation.   3.  Neck pain: Appears musculoskeletal in nature.  Monitor.  Approximately 20 minutes was spent in discussion of which greater than 50% was consultation.  Patient expressed understanding and was in agreement with this plan. She also understands that She can call clinic at any time with any questions, concerns, or complaints.    Lloyd Huger, MD   05/11/2017 8:57 AM

## 2017-05-09 ENCOUNTER — Inpatient Hospital Stay: Payer: Federal, State, Local not specified - PPO | Attending: Oncology | Admitting: Oncology

## 2017-05-09 ENCOUNTER — Encounter: Payer: Self-pay | Admitting: Oncology

## 2017-05-09 VITALS — BP 146/98 | HR 99 | Temp 97.9°F | Resp 16 | Ht 65.0 in | Wt 163.4 lb

## 2017-05-09 DIAGNOSIS — Z803 Family history of malignant neoplasm of breast: Secondary | ICD-10-CM | POA: Diagnosis not present

## 2017-05-09 DIAGNOSIS — Z170 Estrogen receptor positive status [ER+]: Secondary | ICD-10-CM | POA: Diagnosis not present

## 2017-05-09 DIAGNOSIS — Z8041 Family history of malignant neoplasm of ovary: Secondary | ICD-10-CM | POA: Diagnosis not present

## 2017-05-09 DIAGNOSIS — Z79899 Other long term (current) drug therapy: Secondary | ICD-10-CM

## 2017-05-09 DIAGNOSIS — Z79811 Long term (current) use of aromatase inhibitors: Secondary | ICD-10-CM | POA: Diagnosis not present

## 2017-05-09 DIAGNOSIS — R002 Palpitations: Secondary | ICD-10-CM | POA: Insufficient documentation

## 2017-05-09 DIAGNOSIS — K219 Gastro-esophageal reflux disease without esophagitis: Secondary | ICD-10-CM | POA: Diagnosis not present

## 2017-05-09 DIAGNOSIS — E7800 Pure hypercholesterolemia, unspecified: Secondary | ICD-10-CM | POA: Diagnosis not present

## 2017-05-09 DIAGNOSIS — Z923 Personal history of irradiation: Secondary | ICD-10-CM

## 2017-05-09 DIAGNOSIS — C50412 Malignant neoplasm of upper-outer quadrant of left female breast: Secondary | ICD-10-CM | POA: Insufficient documentation

## 2017-05-09 DIAGNOSIS — M542 Cervicalgia: Secondary | ICD-10-CM | POA: Diagnosis not present

## 2017-05-09 DIAGNOSIS — N3281 Overactive bladder: Secondary | ICD-10-CM | POA: Insufficient documentation

## 2017-05-09 DIAGNOSIS — Z90722 Acquired absence of ovaries, bilateral: Secondary | ICD-10-CM | POA: Insufficient documentation

## 2017-05-09 DIAGNOSIS — M129 Arthropathy, unspecified: Secondary | ICD-10-CM | POA: Insufficient documentation

## 2017-05-09 NOTE — Progress Notes (Signed)
Patient here for follow up. Her BP is elevated today. She reports pain in her neck on the left side.

## 2017-07-31 ENCOUNTER — Telehealth: Payer: Self-pay | Admitting: *Deleted

## 2017-07-31 ENCOUNTER — Other Ambulatory Visit: Payer: Self-pay | Admitting: Oncology

## 2017-07-31 NOTE — Telephone Encounter (Signed)
Patient called and states she will be at 5 years of Letrozole the end of June and she has enough medicine to last until 08/10/17. She asks if she needs a refill to last tll the end of the month or can she stop after the las ttab she has on hand. Please advise

## 2017-07-31 NOTE — Telephone Encounter (Signed)
She can stop after completing what she has.

## 2017-07-31 NOTE — Telephone Encounter (Signed)
Call returned to patient and informed that she can stop her letrozole when she completes her current rx

## 2017-08-26 NOTE — Patient Instructions (Addendum)
Georgeanne KATHEY SIMER  08/26/2017   Your procedure is scheduled on: 09-09-17   Report to [facility] Main  Entrance    Report to admitting at 11:15AM    Call this number if you have problems the morning of surgery [phone]     Remember: Do not eat food After Midnight. YOU MAY HAVE CLEAR LIQUIDS FROM MIDNIGHT UNTIL 7:45AM. NOTHING AFTER 7:45AM!     Take these medicines the morning of surgery with A SIP OF WATER: METOPROLOL, OMEPRAZOLE, FLONASE NASAL SPRAY IF NEEDED                                You may not have any metal on your body including hair pins and              piercings  Do not wear jewelry, make-up, lotions, powders or perfumes, deodorant             Do not wear nail polish.  Do not shave  48 hours prior to surgery.             Do not bring valuables to the hospital. [place].  Contacts, dentures or bridgework may not be worn into surgery.  Leave suitcase in the car. After surgery it may be brought to your room.                Please read over the following fact sheets you were given: _____________________________________________________________________     CLEAR LIQUID DIET   Foods Allowed                                                                     Foods Excluded  Coffee and tea, regular and decaf                             liquids that you cannot  Plain Jell-O in any flavor                                             see through such as: Fruit ices (not with fruit pulp)                                     milk, soups, orange juice  Iced Popsicles                                    All solid food Carbonated beverages, regular and diet                                    Cranberry, grape and apple juices  Sports drinks like Gatorade Lightly seasoned clear broth or consume(fat free) Sugar, honey syrup  Sample Menu Breakfast                                Lunch                                      Supper Cranberry juice                    Beef broth                            Chicken broth Jell-O                                     Grape juice                           Apple juice Coffee or tea                        Jell-O                                      Popsicle                                                Coffee or tea                        Coffee or tea  _____________________________________________________________________  [facility] - Preparing for Surgery Before surgery, you can play an important role.  Because skin is not sterile, your skin needs to be as free of germs as possible.  You can reduce the number of germs on your skin by washing with CHG (chlorahexidine gluconate) soap before surgery.  CHG is an antiseptic cleaner which kills germs and bonds with the skin to continue killing germs even after washing. Please DO NOT use if you have an allergy to CHG or antibacterial soaps.  If your skin becomes reddened/irritated stop using the CHG and inform your nurse when you arrive at Short Stay. Do not shave (including legs and underarms) for at least 48 hours prior to the first CHG shower.  You may shave your face/neck. Please follow these instructions carefully:  1.  Shower with CHG Soap the night before surgery and the  morning of Surgery.  2.  If you choose to wash your hair, wash your hair first as usual with your  normal  shampoo.  3.  After you shampoo, rinse your hair and body thoroughly to remove the  shampoo.                           4.  Use CHG as you would any other liquid soap.  You can apply chg directly  to the skin and wash  Gently with a scrungie or clean washcloth.  5.  Apply the CHG Soap to your body ONLY FROM THE NECK DOWN.   Do not use on face/ open                           Wound or open sores. Avoid contact with eyes, ears mouth and genitals (private parts).                       Wash face,  Genitals (private parts)  with your normal soap.             6.  Wash thoroughly, paying special attention to the area where your surgery  will be performed.  7.  Thoroughly rinse your body with warm water from the neck down.  8.  DO NOT shower/wash with your normal soap after using and rinsing off  the CHG Soap.                9.  Pat yourself dry with a clean towel.            10.  Wear clean pajamas.            11.  Place clean sheets on your bed the night of your first shower and do not  sleep with pets. Day of Surgery : Do not apply any lotions/deodorants the morning of surgery.  Please wear clean clothes to the hospital/surgery center.  FAILURE TO FOLLOW THESE INSTRUCTIONS MAY RESULT IN THE CANCELLATION OF YOUR SURGERY PATIENT SIGNATURE_________________________________  NURSE SIGNATURE__________________________________  ________________________________________________________________________   Adam Phenix  An incentive spirometer is a tool that can help keep your lungs clear and active. This tool measures how well you are filling your lungs with each breath. Taking long deep breaths may help reverse or decrease the chance of developing breathing (pulmonary) problems (especially infection) following:  A long period of time when you are unable to move or be active. BEFORE THE PROCEDURE   If the spirometer includes an indicator to show your best effort, your nurse or respiratory therapist will set it to a desired goal.  If possible, sit up straight or lean slightly forward. Try not to slouch.  Hold the incentive spirometer in an upright position. INSTRUCTIONS FOR USE  1. Sit on the edge of your bed if possible, or sit up as far as you can in bed or on a chair. 2. Hold the incentive spirometer in an upright position. 3. Breathe out normally. 4. Place the mouthpiece in your mouth and seal your lips tightly around it. 5. Breathe in slowly and as deeply as possible, raising the piston or the ball  toward the top of the column. 6. Hold your breath for 3-5 seconds or for as long as possible. Allow the piston or ball to fall to the bottom of the column. 7. Remove the mouthpiece from your mouth and breathe out normally. 8. Rest for a few seconds and repeat Steps 1 through 7 at least 10 times every 1-2 hours when you are awake. Take your time and take a few normal breaths between deep breaths. 9. The spirometer may include an indicator to show your best effort. Use the indicator as a goal to work toward during each repetition. 10. After each set of 10 deep breaths, practice coughing to be sure your lungs are clear. If you have an incision (the cut made at the time of  surgery), support your incision when coughing by placing a pillow or rolled up towels firmly against it. Once you are able to get out of bed, walk around indoors and cough well. You may stop using the incentive spirometer when instructed by your caregiver.  RISKS AND COMPLICATIONS  Take your time so you do not get dizzy or light-headed.  If you are in pain, you may need to take or ask for pain medication before doing incentive spirometry. It is harder to take a deep breath if you are having pain. AFTER USE  Rest and breathe slowly and easily.  It can be helpful to keep track of a log of your progress. Your caregiver can provide you with a simple table to help with this. If you are using the spirometer at home, follow these instructions: [place] IF:   You are having difficultly using the spirometer.  You have trouble using the spirometer as often as instructed.  Your pain medication is not giving enough relief while using the spirometer.  You develop fever of 100.5 F (38.1 C) or higher. SEEK IMMEDIATE MEDICAL CARE IF:   You cough up bloody sputum that had not been present before.  You develop fever of 102 F (38.9 C) or greater.  You develop worsening pain at or near the incision site. MAKE SURE YOU:    Understand these instructions.  Will watch your condition.  Will get help right away if you are not doing well or get worse. Document Released: 07/02/2006 Document Revised: 05/14/2011 Document Reviewed: 09/02/2006 ExitCare Patient Information 2014 ExitCare, [place].   ________________________________________________________________________  WHAT IS A BLOOD TRANSFUSION? Blood Transfusion Information  A transfusion is the replacement of blood or some of its parts. Blood is made up of multiple cells which provide different functions.  Red blood cells carry oxygen and are used for blood loss replacement.  White blood cells fight against infection.  Platelets control bleeding.  Plasma helps clot blood.  Other blood products are available for specialized needs, such as hemophilia or other clotting disorders. BEFORE THE TRANSFUSION  Who gives blood for transfusions?   Healthy volunteers who are fully evaluated to make sure their blood is safe. This is blood bank blood. Transfusion therapy is the safest it has ever been in the practice of medicine. Before blood is taken from a donor, a complete history is taken to make sure that person has no history of diseases nor engages in risky social behavior (examples are intravenous drug use or sexual activity with multiple partners). The donor's travel history is screened to minimize risk of transmitting infections, such as malaria. The donated blood is tested for signs of infectious diseases, such as HIV and hepatitis. The blood is then tested to be sure it is compatible with you in order to minimize the chance of a transfusion reaction. If you or a relative donates blood, this is often done in anticipation of surgery and is not appropriate for emergency situations. It takes many days to process the donated blood. RISKS AND COMPLICATIONS Although transfusion therapy is very safe and saves many lives, the main dangers of transfusion include:    Getting an infectious disease.  Developing a transfusion reaction. This is an allergic reaction to something in the blood you were given. Every precaution is taken to prevent this. The decision to have a blood transfusion has been considered carefully by your caregiver before blood is given. Blood is not given unless the benefits outweigh the risks. AFTER THE  TRANSFUSION  Right after receiving a blood transfusion, you will usually feel much better and more energetic. This is especially true if your red blood cells have gotten low (anemic). The transfusion raises the level of the red blood cells which carry oxygen, and this usually causes an energy increase.  The nurse administering the transfusion will monitor you carefully for complications. HOME CARE INSTRUCTIONS  No special instructions are needed after a transfusion. You may find your energy is better. Speak with your caregiver about any limitations on activity for underlying diseases you may have. SEEK MEDICAL CARE IF:   Your condition is not improving after your transfusion.  You develop redness or irritation at the intravenous (IV) site. SEEK IMMEDIATE MEDICAL CARE IF:  Any of the following symptoms occur over the next 12 hours:  Shaking chills.  You have a temperature by mouth above 102 F (38.9 C), not controlled by medicine.  Chest, back, or muscle pain.  People around you feel you are not acting correctly or are confused.  Shortness of breath or difficulty breathing.  Dizziness and fainting.  You get a rash or develop hives.  You have a decrease in urine output.  Your urine turns a dark color or changes to pink, red, or brown. Any of the following symptoms occur over the next 10 days:  You have a temperature by mouth above 102 F (38.9 C), not controlled by medicine.  Shortness of breath.  Weakness after normal activity.  The white part of the eye turns yellow (jaundice).  You have a decrease in the  amount of urine or are urinating less often.  Your urine turns a dark color or changes to pink, red, or brown. Document Released: 02/17/2000 Document Revised: 05/14/2011 Document Reviewed: 10/06/2007 [facility] Patient Information 2014 [place], [place].  _______________________________________________________________________

## 2017-08-27 ENCOUNTER — Encounter (HOSPITAL_COMMUNITY)
Admission: RE | Admit: 2017-08-27 | Discharge: 2017-08-27 | Disposition: A | Discharge status: Home / Self Care | Payer: Federal, State, Local not specified - PPO | Source: Ambulatory Visit | Attending: Orthopedic Surgery | Admitting: Orthopedic Surgery

## 2017-08-27 ENCOUNTER — Encounter (HOSPITAL_COMMUNITY): Payer: Self-pay

## 2017-08-27 ENCOUNTER — Other Ambulatory Visit: Payer: Self-pay

## 2017-08-27 ENCOUNTER — Encounter (INDEPENDENT_AMBULATORY_CARE_PROVIDER_SITE_OTHER): Payer: Self-pay

## 2017-08-27 DIAGNOSIS — Z01812 Encounter for preprocedural laboratory examination: Secondary | ICD-10-CM | POA: Insufficient documentation

## 2017-08-27 DIAGNOSIS — M1712 Unilateral primary osteoarthritis, left knee: Secondary | ICD-10-CM | POA: Diagnosis not present

## 2017-08-27 LAB — URINALYSIS, ROUTINE W REFLEX MICROSCOPIC
Bacteria, UA: NONE SEEN
Bilirubin Urine: NEGATIVE
GLUCOSE, UA: NEGATIVE mg/dL
Ketones, ur: NEGATIVE mg/dL
Leukocytes, UA: NEGATIVE
Nitrite: NEGATIVE
Protein, ur: NEGATIVE mg/dL
SPECIFIC GRAVITY, URINE: 1.024 (ref 1.005–1.030)
pH: 5 (ref 5.0–8.0)

## 2017-08-27 LAB — CBC
HEMATOCRIT: 41 % (ref 36.0–46.0)
Hemoglobin: 13.6 g/dL (ref 12.0–15.0)
MCH: 31.2 pg (ref 26.0–34.0)
MCHC: 33.2 g/dL (ref 30.0–36.0)
MCV: 94 fL (ref 78.0–100.0)
Platelets: 275 10*3/uL (ref 150–400)
RBC: 4.36 MIL/uL (ref 3.87–5.11)
RDW: 13.5 % (ref 11.5–15.5)
WBC: 6.2 10*3/uL (ref 4.0–10.5)

## 2017-08-27 LAB — COMPREHENSIVE METABOLIC PANEL
ALBUMIN: 3.8 g/dL (ref 3.5–5.0)
ALT: 25 U/L (ref 0–44)
AST: 19 U/L (ref 15–41)
Alkaline Phosphatase: 83 U/L (ref 38–126)
Anion gap: 5 (ref 5–15)
BILIRUBIN TOTAL: 0.5 mg/dL (ref 0.3–1.2)
BUN: 14 mg/dL (ref 6–20)
CALCIUM: 8.6 mg/dL — AB (ref 8.9–10.3)
CO2: 28 mmol/L (ref 22–32)
Chloride: 109 mmol/L (ref 98–111)
Creatinine, Ser: 0.83 mg/dL (ref 0.44–1.00)
GFR calc non Af Amer: >60 mL/min (ref >60)
Glucose, Bld: 94 mg/dL (ref 70–99)
Potassium: 4.4 mmol/L (ref 3.5–5.1)
SODIUM: 142 mmol/L (ref 135–145)
TOTAL PROTEIN: 6.8 g/dL (ref 6.5–8.1)

## 2017-08-27 LAB — PROTIME-INR
INR: 1.01
PROTHROMBIN TIME: 13.2 s (ref 11.4–15.2)

## 2017-08-27 LAB — APTT: aPTT: 28 seconds (ref 24–36)

## 2017-08-27 NOTE — H&P (Addendum)
TOTAL KNEE ADMISSION H&P  Patient is being admitted for left total knee arthroplasty.  Subjective:  Chief Complaint:left knee pain.  HPI: Debbie Floyd, 62 y.o. female, has a history of pain and functional disability in the left knee due to arthritis and has failed non-surgical conservative treatments for greater than 12 weeks to includeNSAID's and/or analgesics, corticosteriod injections, viscosupplementation injections and activity modification.  Onset of symptoms was gradual, starting several years ago with gradually worsening course since that time. The patient noted no past surgery on the left knee(s).  Patient currently rates pain in the left knee(s) at 7 out of 10 with activity. Patient has night pain, worsening of pain with activity and weight bearing and pain that interferes with activities of daily living.  Patient has evidence of bone on bone arthritis medial and patellofemoral on the left by imaging studies. There is no active infection.  Patient Active Problem List   Diagnosis Date Noted  . Breast cancer of upper-outer quadrant of left female breast ([place]) 09/14/2014  . Acid reflux 09/14/2014  . History of palpitations 09/14/2014  . H/O malignant neoplasm of breast 09/14/2014  . Acne erythematosa 09/14/2014  . Colitis gravis ([place]) 09/14/2014  . HLD (hyperlipidemia) 11/23/2013   Past Medical History:  Diagnosis Date  . Arthritis    RIGHT KNEE  . Breast cancer ([place]) 2014   lumpectomy lt  . Cancer of left breast ([place]) 09/14/2014  . Dysrhythmia    PALPITATIONS-WELL CONTROLLED ON METOPROLOL (07-19-15)  . GERD (gastroesophageal reflux disease)   . Hypercholesterolemia   . Overactive bladder     Past Surgical History:  Procedure Laterality Date  . 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 BREAST LUMPECTOMY Left 2014   with radiation  . CHOLECYSTECTOMY N/A 07/26/2015   Procedure: LAPAROSCOPIC CHOLECYSTECTOMY WITH INTRAOPERATIVE CHOLANGIOGRAM;  Surgeon: Leonie Green, MD;  Location: [facility] ORS;  Service: General;  Laterality: N/A;  . OOPHORECTOMY Bilateral 2014    No current facility-administered medications for this encounter.    Current Outpatient Medications  Medication Sig Dispense Refill Last Dose  . atorvastatin (LIPITOR) 10 MG tablet Take 5 mg by mouth every evening.     . Calcium Carb-Cholecalciferol (CALCIUM + D3) 600-200 MG-UNIT TABS Take 1 tablet by mouth 2 (two) times daily. 180 tablet 4 Taking  . Cranberry 450 MG CAPS Take 450 mg by mouth daily.     . cyclobenzaprine (FLEXERIL) 5 MG tablet Take 5 mg by mouth at bedtime as needed for muscle spasms.  0   . doxycycline (VIBRAMYCIN) 100 MG capsule Take 100 mg by mouth every Monday, Wednesday, and Friday.      . fluorometholone (FML) 0.1 % ophthalmic suspension Place 1 drop into both eyes daily as needed (dry eyes).     . fluticasone (FLONASE) 50 MCG/ACT nasal spray Place 1 spray into both nostrils daily as needed for allergies or rhinitis.     Marland Kitchen mesalamine (LIALDA) 1.2 G EC tablet Take 2.4 G by mouth every morning   Taking  . metoprolol succinate (TOPROL-XL) 50 MG 24 hr tablet Take 50 mg by mouth every morning.    Taking  . omeprazole (PRILOSEC) 20 MG capsule Take 20 mg by mouth 2 (two) times daily.    Taking  . oxybutynin (DITROPAN) 5 MG tablet Take 5 mg by mouth every morning.    Taking  . letrozole (FEMARA) 2.5 MG tablet Take 1 tablet (2.5 mg total) by mouth daily. (Patient not  taking: Reported on 08/21/2017) 90 tablet 3 Completed Course at Unknown time   Allergies  Allergen Reactions  . Adhesive [Tape] Rash    IF LEFT ON FOR LONG PERIOD OF TIME  . Penicillins Rash and Other (See Comments)    febrile Has patient had a PCN reaction causing immediate rash, facial/tongue/throat swelling, SOB or lightheadedness with hypotension: Yes Has patient had a PCN reaction causing severe rash involving mucus membranes or skin necrosis: No Has patient had a PCN reaction that required  hospitalization: Yes Has patient had a PCN reaction occurring within the last 10 years: No If all of the above answers are "NO", then may proceed with Cephalosporin use.   . Sulfa Antibiotics Other (See Comments) and Rash    febrile    Social History   Tobacco Use  . Smoking status: Never Smoker  . Smokeless tobacco: Never Used  Substance Use Topics  . Alcohol use: No    Family History  Problem Relation Age of Onset  . Breast cancer Paternal Aunt   . Ovarian cancer Maternal Grandmother 80  . Breast cancer Cousin   . Breast cancer Cousin      Review of Systems  Constitutional: Negative for chills and fever.  HENT: Negative for congestion, sore throat and tinnitus.   Eyes: Negative for double vision, photophobia and pain.  Respiratory: Negative for cough, shortness of breath and wheezing.   Cardiovascular: Negative for chest pain, palpitations and orthopnea.  Gastrointestinal: Negative for heartburn, nausea and vomiting.  Genitourinary: Negative for dysuria, frequency and urgency.  Musculoskeletal: Positive for joint pain.  Neurological: Negative for dizziness, weakness and headaches.  Psychiatric/Behavioral: Negative for depression.    Objective:  Exam: Well nourished and well developed. General: Alert and oriented x3, cooperative and pleasant, no acute distress. Head: normocephalic, atraumatic, neck supple. Eyes: EOMI. Respiratory: breath sounds clear in all fields, no wheezing, rales, or rhonchi. Cardiovascular: Regular rate and rhythm, no murmurs, gallops or rubs.  Abdomen: non-tender to palpation and soft, normoactive bowel sounds. Musculoskeletal: Right hip: flexion to 120 rotation in 30 abduction 40 and external rotation of 40. There is no tenderness over the greater trochanter. There is no pain on provocative testing of the hip.  Left hip: flexion to 120 rotation in 30 out 40 and abduction 40 without discomfort. There is no tenderness over the greater trochanter.  There is no pain on provocative testing of the hip.  Right knee: no effusion. Range 0-130 with moderate crepitus. She is tender medial greater than lateral with no instability.  Left knee: trace effusion. Range 5-125 with moderate crepitus. Tender medial greater than lateral with no instability. Calves soft and nontender. Motor function intact in LE. Strength 5/5 LE bilaterally. Neuro: Distal pulses 2+. Sensation to light touch intact in LE.  Vital signs in last 24 hours: Blood pressure: 124/82 mmHg Pulse: 60 bpm  Labs: Estimated body mass index is 27.19 kg/m as calculated from the following:   Height as of 05/09/17: 5' 5"  (1.651 m).   Weight as of 05/09/17: 74.1 kg (163 lb 6.4 oz).   Imaging Review Plain radiographs demonstrate severe degenerative joint disease of the left knee(s). The overall alignment ismild varus. The bone quality appears to be adequate for age and reported activity level.   Preoperative templating of the joint replacement has been completed, documented, and submitted to the Operating Room personnel in order to optimize intra-operative equipment management.   Anticipated LOS equal to or greater than 2 midnights due to -  Age 48 and older with one or more of the following:  - Obesity  - Expected need for hospital services (PT, OT, Nursing) required for safe  discharge  - Anticipated need for postoperative skilled nursing care or inpatient rehab  - Active co-morbidities: None OR   - Unanticipated findings during/Post Surgery: None  - Patient is a high risk of re-admission due to: None  Assessment/Plan:  End stage arthritis, left knee   The patient history, physical examination, clinical judgment of the provider and imaging studies are consistent with end stage degenerative joint disease of the left knee(s) and total knee arthroplasty is deemed medically necessary. The treatment options including medical management, injection therapy arthroscopy and arthroplasty  were discussed at length. The risks and benefits of total knee arthroplasty were presented and reviewed. The risks due to aseptic loosening, infection, stiffness, patella tracking problems, thromboembolic complications and other imponderables were discussed. The patient acknowledged the explanation, agreed to proceed with the plan and consent was signed. Patient is being admitted for inpatient treatment for surgery, pain control, PT, OT, prophylactic antibiotics, VTE prophylaxis, progressive ambulation and ADL's and discharge planning. The patient is planning to be discharged home with outpatient physical therapy.   Therapy Plans: outpatient therapy at [facility] in [place], [place] Disposition: Home with husband Planned DVT Prophylaxis: Xarelto 10 mg daily (history of breast cancer) DME needed: None PCP: Dr. Baldemar Lenis  TXA: IV Allergies: PCN and Sulfa NO BP/IV in LEFT arm  - Patient was instructed on what medications to stop prior to surgery. - Follow-up visit in 2 weeks with Dr. Wynelle Link - Begin physical therapy following surgery - Pre-operative lab work as pre-surgical testing - Prescriptions will be provided in hospital at time of discharge  Theresa Duty, PA-C Orthopedic Surgery [facility] [place]

## 2017-08-28 LAB — SURGICAL PCR SCREEN
MRSA, PCR: POSITIVE — AB
Staphylococcus aureus: POSITIVE — AB

## 2017-09-06 NOTE — Progress Notes (Signed)
SURGICAL CLEARANCE , DR MARCUS BABAOFF , ON CHART 09-03-17  EKG ON CHART FROM BABAOFF OFFICE   LOV DR. Jerilynn Mages. BABAOFF 09-03-17 ON CHART

## 2017-09-08 MED ORDER — TRANEXAMIC ACID 1000 MG/10ML IV SOLN
1000.0000 mg | INTRAVENOUS | Status: AC
  Administered 2017-09-09: 1000 mg via INTRAVENOUS
  Filled 2017-09-08: qty 1100

## 2017-09-08 MED ORDER — BUPIVACAINE LIPOSOME 1.3 % IJ SUSP
20.0000 mL | Freq: Once | INTRAMUSCULAR | Status: DC
  Filled 2017-09-08: qty 20

## 2017-09-09 ENCOUNTER — Inpatient Hospital Stay (HOSPITAL_COMMUNITY): Payer: Federal, State, Local not specified - PPO | Admitting: Anesthesiology

## 2017-09-09 ENCOUNTER — Encounter (HOSPITAL_COMMUNITY)
Admission: RE | Disposition: A | Discharge status: Home / Self Care | Payer: Self-pay | Source: Home / Self Care | Attending: Orthopedic Surgery

## 2017-09-09 ENCOUNTER — Inpatient Hospital Stay (HOSPITAL_COMMUNITY)
Admission: RE | Admit: 2017-09-09 | Discharge: 2017-09-11 | DRG: 470 | Disposition: A | Discharge status: Home / Self Care | Payer: Federal, State, Local not specified - PPO | Attending: Orthopedic Surgery | Admitting: Orthopedic Surgery

## 2017-09-09 ENCOUNTER — Encounter (HOSPITAL_COMMUNITY): Payer: Self-pay | Admitting: Emergency Medicine

## 2017-09-09 ENCOUNTER — Other Ambulatory Visit: Payer: Self-pay

## 2017-09-09 DIAGNOSIS — M1710 Unilateral primary osteoarthritis, unspecified knee: Secondary | ICD-10-CM | POA: Diagnosis present

## 2017-09-09 DIAGNOSIS — E7800 Pure hypercholesterolemia, unspecified: Secondary | ICD-10-CM | POA: Diagnosis present

## 2017-09-09 DIAGNOSIS — E785 Hyperlipidemia, unspecified: Secondary | ICD-10-CM | POA: Diagnosis present

## 2017-09-09 DIAGNOSIS — K219 Gastro-esophageal reflux disease without esophagitis: Secondary | ICD-10-CM | POA: Diagnosis present

## 2017-09-09 DIAGNOSIS — Z8041 Family history of malignant neoplasm of ovary: Secondary | ICD-10-CM

## 2017-09-09 DIAGNOSIS — Z853 Personal history of malignant neoplasm of breast: Secondary | ICD-10-CM

## 2017-09-09 DIAGNOSIS — M1712 Unilateral primary osteoarthritis, left knee: Principal | ICD-10-CM | POA: Diagnosis present

## 2017-09-09 DIAGNOSIS — Z923 Personal history of irradiation: Secondary | ICD-10-CM

## 2017-09-09 DIAGNOSIS — Z9049 Acquired absence of other specified parts of digestive tract: Secondary | ICD-10-CM

## 2017-09-09 DIAGNOSIS — I959 Hypotension, unspecified: Secondary | ICD-10-CM | POA: Diagnosis not present

## 2017-09-09 DIAGNOSIS — M179 Osteoarthritis of knee, unspecified: Secondary | ICD-10-CM | POA: Diagnosis present

## 2017-09-09 DIAGNOSIS — Z90722 Acquired absence of ovaries, bilateral: Secondary | ICD-10-CM | POA: Diagnosis not present

## 2017-09-09 DIAGNOSIS — Z803 Family history of malignant neoplasm of breast: Secondary | ICD-10-CM | POA: Diagnosis not present

## 2017-09-09 HISTORY — PX: TOTAL KNEE ARTHROPLASTY: SHX125

## 2017-09-09 LAB — TYPE AND SCREEN
ABO/RH(D): A POS
Antibody Screen: NEGATIVE

## 2017-09-09 LAB — ABO/RH: ABO/RH(D): A POS

## 2017-09-09 SURGERY — ARTHROPLASTY, KNEE, TOTAL
Anesthesia: General | Site: Knee | Laterality: Left

## 2017-09-09 MED ORDER — METHOCARBAMOL 500 MG PO TABS
500.0000 mg | ORAL_TABLET | Freq: Four times a day (QID) | ORAL | Status: DC | PRN
  Administered 2017-09-09 – 2017-09-11 (×3): 500 mg via ORAL
  Filled 2017-09-09 (×3): qty 1

## 2017-09-09 MED ORDER — PANTOPRAZOLE SODIUM 40 MG PO TBEC
40.0000 mg | DELAYED_RELEASE_TABLET | Freq: Every day | ORAL | Status: DC
Start: 2017-09-10 — End: 2017-09-11
  Administered 2017-09-10 – 2017-09-11 (×2): 40 mg via ORAL
  Filled 2017-09-09 (×2): qty 1

## 2017-09-09 MED ORDER — DEXAMETHASONE SODIUM PHOSPHATE 10 MG/ML IJ SOLN
INTRAMUSCULAR | Status: AC
  Filled 2017-09-09: qty 1

## 2017-09-09 MED ORDER — METOCLOPRAMIDE HCL 5 MG PO TABS
5.0000 mg | ORAL_TABLET | Freq: Three times a day (TID) | ORAL | Status: DC | PRN
  Administered 2017-09-10: 5 mg via ORAL
  Administered 2017-09-10: 10 mg via ORAL
  Filled 2017-09-09: qty 1
  Filled 2017-09-09: qty 2

## 2017-09-09 MED ORDER — VANCOMYCIN HCL IN DEXTROSE 1-5 GM/200ML-% IV SOLN
1000.0000 mg | INTRAVENOUS | Status: AC
  Administered 2017-09-09: 1000 mg via INTRAVENOUS
  Filled 2017-09-09: qty 200

## 2017-09-09 MED ORDER — MEPERIDINE HCL 50 MG/ML IJ SOLN: 6.2500 mg | INTRAMUSCULAR | Status: DC | PRN

## 2017-09-09 MED ORDER — DOCUSATE SODIUM 100 MG PO CAPS
100.0000 mg | ORAL_CAPSULE | Freq: Two times a day (BID) | ORAL | Status: DC
  Administered 2017-09-09 – 2017-09-11 (×4): 100 mg via ORAL
  Filled 2017-09-09 (×4): qty 1

## 2017-09-09 MED ORDER — FENTANYL CITRATE (PF) 100 MCG/2ML IJ SOLN
INTRAMUSCULAR | Status: DC | PRN
  Administered 2017-09-09 (×4): 25 ug via INTRAVENOUS

## 2017-09-09 MED ORDER — FENTANYL CITRATE (PF) 100 MCG/2ML IJ SOLN
50.0000 ug | INTRAMUSCULAR | Status: DC
Start: 2017-09-09 — End: 2017-09-09
  Administered 2017-09-09: 100 ug via INTRAVENOUS
  Filled 2017-09-09: qty 2

## 2017-09-09 MED ORDER — PROPOFOL 10 MG/ML IV BOLUS
INTRAVENOUS | Status: DC | PRN
  Administered 2017-09-09 (×2): 20 mg via INTRAVENOUS
  Administered 2017-09-09: 150 mg via INTRAVENOUS
  Administered 2017-09-09: 20 mg via INTRAVENOUS

## 2017-09-09 MED ORDER — POLYETHYLENE GLYCOL 3350 17 G PO PACK: 17.0000 g | PACK | Freq: Every day | ORAL | Status: DC | PRN

## 2017-09-09 MED ORDER — ONDANSETRON HCL 4 MG/2ML IJ SOLN
INTRAMUSCULAR | Status: DC | PRN
  Administered 2017-09-09: 4 mg via INTRAVENOUS

## 2017-09-09 MED ORDER — DIPHENHYDRAMINE HCL 12.5 MG/5ML PO ELIX: 12.5000 mg | ORAL_SOLUTION | ORAL | Status: DC | PRN

## 2017-09-09 MED ORDER — ACETAMINOPHEN 500 MG PO TABS
1000.0000 mg | ORAL_TABLET | Freq: Four times a day (QID) | ORAL | Status: AC
  Administered 2017-09-09 – 2017-09-10 (×3): 1000 mg via ORAL
  Filled 2017-09-09 (×3): qty 2

## 2017-09-09 MED ORDER — SODIUM CHLORIDE 0.9 % IJ SOLN
INTRAMUSCULAR | Status: AC
  Filled 2017-09-09: qty 10

## 2017-09-09 MED ORDER — MESALAMINE 1.2 G PO TBEC
2.4000 g | DELAYED_RELEASE_TABLET | Freq: Every day | ORAL | Status: DC
  Administered 2017-09-10 – 2017-09-11 (×2): 2.4 g via ORAL
  Filled 2017-09-09 (×2): qty 2

## 2017-09-09 MED ORDER — HYDROMORPHONE HCL 1 MG/ML IJ SOLN
INTRAMUSCULAR | Status: AC
  Filled 2017-09-09: qty 1

## 2017-09-09 MED ORDER — METHOCARBAMOL 1000 MG/10ML IJ SOLN
500.0000 mg | Freq: Four times a day (QID) | INTRAVENOUS | Status: DC | PRN
  Administered 2017-09-09: 500 mg via INTRAVENOUS
  Filled 2017-09-09: qty 550

## 2017-09-09 MED ORDER — OXYCODONE HCL 5 MG PO TABS
5.0000 mg | ORAL_TABLET | ORAL | Status: DC | PRN
  Administered 2017-09-09 – 2017-09-10 (×3): 5 mg via ORAL
  Filled 2017-09-09 (×2): qty 1
  Filled 2017-09-09: qty 2
  Filled 2017-09-09: qty 1

## 2017-09-09 MED ORDER — HYDROMORPHONE HCL 1 MG/ML IJ SOLN
INTRAMUSCULAR | Status: AC
  Administered 2017-09-09: 1 mg
  Filled 2017-09-09: qty 1

## 2017-09-09 MED ORDER — BUPIVACAINE LIPOSOME 1.3 % IJ SUSP
INTRAMUSCULAR | Status: DC | PRN
  Administered 2017-09-09: 20 mL

## 2017-09-09 MED ORDER — METOPROLOL SUCCINATE ER 50 MG PO TB24
50.0000 mg | ORAL_TABLET | Freq: Every day | ORAL | Status: DC
  Administered 2017-09-10 – 2017-09-11 (×2): 50 mg via ORAL
  Filled 2017-09-09 (×3): qty 1

## 2017-09-09 MED ORDER — TRAMADOL HCL 50 MG PO TABS
50.0000 mg | ORAL_TABLET | Freq: Four times a day (QID) | ORAL | Status: DC | PRN
  Administered 2017-09-09 – 2017-09-11 (×5): 100 mg via ORAL
  Filled 2017-09-09 (×6): qty 2

## 2017-09-09 MED ORDER — FLUTICASONE PROPIONATE 50 MCG/ACT NA SUSP
1.0000 | Freq: Every day | NASAL | Status: DC | PRN
  Filled 2017-09-09: qty 16

## 2017-09-09 MED ORDER — SODIUM CHLORIDE 0.9 % IV SOLN: INTRAVENOUS | Status: DC

## 2017-09-09 MED ORDER — ONDANSETRON HCL 4 MG/2ML IJ SOLN
INTRAMUSCULAR | Status: AC
  Filled 2017-09-09: qty 2

## 2017-09-09 MED ORDER — ONDANSETRON HCL 4 MG PO TABS
4.0000 mg | ORAL_TABLET | Freq: Four times a day (QID) | ORAL | Status: DC | PRN
  Administered 2017-09-09: 4 mg via ORAL
  Filled 2017-09-09: qty 1

## 2017-09-09 MED ORDER — HYDROMORPHONE HCL 1 MG/ML IJ SOLN
0.2500 mg | INTRAMUSCULAR | Status: DC | PRN
  Administered 2017-09-09 (×2): 0.5 mg via INTRAVENOUS

## 2017-09-09 MED ORDER — MENTHOL 3 MG MT LOZG: 1.0000 | LOZENGE | OROMUCOSAL | Status: DC | PRN

## 2017-09-09 MED ORDER — MIDAZOLAM HCL 2 MG/2ML IJ SOLN
1.0000 mg | INTRAMUSCULAR | Status: DC
  Administered 2017-09-09: 2 mg via INTRAVENOUS
  Filled 2017-09-09: qty 2

## 2017-09-09 MED ORDER — LACTATED RINGERS IV SOLN
INTRAVENOUS | Status: DC
  Administered 2017-09-09 (×2): via INTRAVENOUS

## 2017-09-09 MED ORDER — OXYBUTYNIN CHLORIDE 5 MG PO TABS
5.0000 mg | ORAL_TABLET | Freq: Every day | ORAL | Status: DC
  Administered 2017-09-10 – 2017-09-11 (×2): 5 mg via ORAL
  Filled 2017-09-09 (×2): qty 1

## 2017-09-09 MED ORDER — FLEET ENEMA 7-19 GM/118ML RE ENEM: 1.0000 | ENEMA | Freq: Once | RECTAL | Status: DC | PRN

## 2017-09-09 MED ORDER — OXYCODONE HCL 5 MG PO TABS: 10.0000 mg | ORAL_TABLET | ORAL | Status: DC | PRN

## 2017-09-09 MED ORDER — BISACODYL 10 MG RE SUPP: 10.0000 mg | Freq: Every day | RECTAL | Status: DC | PRN

## 2017-09-09 MED ORDER — EPHEDRINE 5 MG/ML INJ
INTRAVENOUS | Status: AC
  Filled 2017-09-09: qty 10

## 2017-09-09 MED ORDER — STERILE WATER FOR IRRIGATION IR SOLN
Status: DC | PRN
  Administered 2017-09-09: 2000 mL

## 2017-09-09 MED ORDER — GABAPENTIN 300 MG PO CAPS
300.0000 mg | ORAL_CAPSULE | Freq: Once | ORAL | Status: AC
  Administered 2017-09-09: 300 mg via ORAL
  Filled 2017-09-09: qty 1

## 2017-09-09 MED ORDER — CEFAZOLIN SODIUM-DEXTROSE 2-4 GM/100ML-% IV SOLN
2.0000 g | INTRAVENOUS | Status: DC
  Filled 2017-09-09: qty 100

## 2017-09-09 MED ORDER — EPHEDRINE SULFATE-NACL 50-0.9 MG/10ML-% IV SOSY
PREFILLED_SYRINGE | INTRAVENOUS | Status: DC | PRN
  Administered 2017-09-09: 5 mg via INTRAVENOUS

## 2017-09-09 MED ORDER — SODIUM CHLORIDE 0.9 % IJ SOLN
INTRAMUSCULAR | Status: AC
  Filled 2017-09-09: qty 50

## 2017-09-09 MED ORDER — ONDANSETRON HCL 4 MG/2ML IJ SOLN: 4.0000 mg | Freq: Once | INTRAMUSCULAR | Status: DC | PRN

## 2017-09-09 MED ORDER — RIVAROXABAN 10 MG PO TABS
10.0000 mg | ORAL_TABLET | Freq: Every day | ORAL | Status: DC
  Administered 2017-09-10 – 2017-09-11 (×2): 10 mg via ORAL
  Filled 2017-09-09 (×2): qty 1

## 2017-09-09 MED ORDER — CYCLOBENZAPRINE HCL 5 MG PO TABS: 5.0000 mg | ORAL_TABLET | Freq: Every evening | ORAL | Status: DC | PRN

## 2017-09-09 MED ORDER — FENTANYL CITRATE (PF) 100 MCG/2ML IJ SOLN
INTRAMUSCULAR | Status: AC
  Filled 2017-09-09: qty 2

## 2017-09-09 MED ORDER — VANCOMYCIN HCL IN DEXTROSE 1-5 GM/200ML-% IV SOLN
1000.0000 mg | Freq: Two times a day (BID) | INTRAVENOUS | Status: AC
  Administered 2017-09-10: 1000 mg via INTRAVENOUS
  Filled 2017-09-09: qty 200

## 2017-09-09 MED ORDER — TRANEXAMIC ACID 1000 MG/10ML IV SOLN
1000.0000 mg | Freq: Once | INTRAVENOUS | Status: AC
  Administered 2017-09-09: 1000 mg via INTRAVENOUS
  Filled 2017-09-09: qty 10

## 2017-09-09 MED ORDER — DEXAMETHASONE SODIUM PHOSPHATE 10 MG/ML IJ SOLN
10.0000 mg | Freq: Once | INTRAMUSCULAR | Status: DC
  Filled 2017-09-09: qty 1

## 2017-09-09 MED ORDER — ATORVASTATIN CALCIUM 10 MG PO TABS
5.0000 mg | ORAL_TABLET | Freq: Every evening | ORAL | Status: DC
  Administered 2017-09-10: 5 mg via ORAL
  Filled 2017-09-09: qty 1

## 2017-09-09 MED ORDER — METOCLOPRAMIDE HCL 5 MG/ML IJ SOLN: 5.0000 mg | Freq: Three times a day (TID) | INTRAMUSCULAR | Status: DC | PRN

## 2017-09-09 MED ORDER — 0.9 % SODIUM CHLORIDE (POUR BTL) OPTIME
TOPICAL | Status: DC | PRN
  Administered 2017-09-09: 1000 mL

## 2017-09-09 MED ORDER — ROPIVACAINE HCL 7.5 MG/ML IJ SOLN
INTRAMUSCULAR | Status: DC | PRN
  Administered 2017-09-09: 20 mL via PERINEURAL

## 2017-09-09 MED ORDER — PROPOFOL 10 MG/ML IV BOLUS
INTRAVENOUS | Status: AC
  Filled 2017-09-09: qty 60

## 2017-09-09 MED ORDER — PHENOL 1.4 % MT LIQD: 1.0000 | OROMUCOSAL | Status: DC | PRN

## 2017-09-09 MED ORDER — SODIUM CHLORIDE 0.9 % IJ SOLN
INTRAMUSCULAR | Status: DC | PRN
  Administered 2017-09-09: 60 mL

## 2017-09-09 MED ORDER — SODIUM CHLORIDE 0.9 % IR SOLN
Status: DC | PRN
  Administered 2017-09-09: 1000 mL

## 2017-09-09 MED ORDER — DEXAMETHASONE SODIUM PHOSPHATE 10 MG/ML IJ SOLN
8.0000 mg | Freq: Once | INTRAMUSCULAR | Status: AC
  Administered 2017-09-09: 10 mg via INTRAVENOUS

## 2017-09-09 MED ORDER — CHLORHEXIDINE GLUCONATE 4 % EX LIQD: 60.0000 mL | Freq: Once | CUTANEOUS | Status: DC

## 2017-09-09 MED ORDER — ACETAMINOPHEN 10 MG/ML IV SOLN
1000.0000 mg | Freq: Four times a day (QID) | INTRAVENOUS | Status: DC
  Administered 2017-09-09: 1000 mg via INTRAVENOUS
  Filled 2017-09-09: qty 100

## 2017-09-09 MED ORDER — ONDANSETRON HCL 4 MG/2ML IJ SOLN: 4.0000 mg | Freq: Four times a day (QID) | INTRAMUSCULAR | Status: DC | PRN

## 2017-09-09 MED ORDER — HYDROMORPHONE HCL 1 MG/ML IJ SOLN: 0.5000 mg | INTRAMUSCULAR | Status: DC | PRN

## 2017-09-09 SURGICAL SUPPLY — 51 items
BAG ZIPLOCK 12X15 (MISCELLANEOUS) ×3 IMPLANT
BANDAGE ACE 6X5 VEL STRL LF (GAUZE/BANDAGES/DRESSINGS) ×3 IMPLANT
BLADE SAG 18X100X1.27 (BLADE) ×3 IMPLANT
BLADE SAW SGTL 11.0X1.19X90.0M (BLADE) ×3 IMPLANT
BOWL SMART MIX CTS (DISPOSABLE) ×3 IMPLANT
CAPT KNEE TOTAL 3 ATTUNE ×3 IMPLANT
CEMENT HV SMART SET (Cement) ×6 IMPLANT
CLOSURE WOUND 1/2 X4 (GAUZE/BANDAGES/DRESSINGS) ×2
COVER SURGICAL LIGHT HANDLE (MISCELLANEOUS) ×3 IMPLANT
CUFF TOURN SGL QUICK 34 (TOURNIQUET CUFF) ×2
CUFF TRNQT CYL 34X4X40X1 (TOURNIQUET CUFF) ×1 IMPLANT
DECANTER SPIKE VIAL GLASS SM (MISCELLANEOUS) ×3 IMPLANT
DRAPE U-SHAPE 47X51 STRL (DRAPES) ×3 IMPLANT
DRSG ADAPTIC 3X8 NADH LF (GAUZE/BANDAGES/DRESSINGS) ×3 IMPLANT
DURAPREP 26ML APPLICATOR (WOUND CARE) ×3 IMPLANT
ELECT REM PT RETURN 15FT ADLT (MISCELLANEOUS) ×3 IMPLANT
EVACUATOR 1/8 PVC DRAIN (DRAIN) ×3 IMPLANT
GAUZE SPONGE 4X4 12PLY STRL (GAUZE/BANDAGES/DRESSINGS) ×3 IMPLANT
GLOVE BIO SURGEON STRL SZ7 (GLOVE) ×3 IMPLANT
GLOVE BIO SURGEON STRL SZ8 (GLOVE) ×6 IMPLANT
GLOVE BIOGEL PI IND STRL 6.5 (GLOVE) ×1 IMPLANT
GLOVE BIOGEL PI IND STRL 7.0 (GLOVE) ×4 IMPLANT
GLOVE BIOGEL PI IND STRL 7.5 (GLOVE) ×2 IMPLANT
GLOVE BIOGEL PI IND STRL 8 (GLOVE) ×1 IMPLANT
GLOVE BIOGEL PI INDICATOR 6.5 (GLOVE) ×2
GLOVE BIOGEL PI INDICATOR 7.0 (GLOVE) ×8
GLOVE BIOGEL PI INDICATOR 7.5 (GLOVE) ×4
GLOVE BIOGEL PI INDICATOR 8 (GLOVE) ×2
GLOVE SURG SS PI 6.5 STRL IVOR (GLOVE) ×3 IMPLANT
GOWN STRL REUS W/ TWL XL LVL3 (GOWN DISPOSABLE) ×1 IMPLANT
GOWN STRL REUS W/TWL LRG LVL3 (GOWN DISPOSABLE) ×9 IMPLANT
GOWN STRL REUS W/TWL XL LVL3 (GOWN DISPOSABLE) ×2
HANDPIECE INTERPULSE COAX TIP (DISPOSABLE) ×2
HOLDER FOLEY CATH W/STRAP (MISCELLANEOUS) ×3 IMPLANT
IMMOBILIZER KNEE 20 (SOFTGOODS) ×3
IMMOBILIZER KNEE 20 THIGH 36 (SOFTGOODS) ×1 IMPLANT
MANIFOLD NEPTUNE II (INSTRUMENTS) ×3 IMPLANT
PACK TOTAL KNEE CUSTOM (KITS) ×3 IMPLANT
PAD ABD 8X10 STRL (GAUZE/BANDAGES/DRESSINGS) ×3 IMPLANT
PADDING CAST COTTON 6X4 STRL (CAST SUPPLIES) ×9 IMPLANT
POSITIONER SURGICAL ARM (MISCELLANEOUS) ×3 IMPLANT
SET HNDPC FAN SPRY TIP SCT (DISPOSABLE) ×1 IMPLANT
STRIP CLOSURE SKIN 1/2X4 (GAUZE/BANDAGES/DRESSINGS) ×4 IMPLANT
SUT MNCRL AB 4-0 PS2 18 (SUTURE) ×3 IMPLANT
SUT STRATAFIX 0 PDS 27 VIOLET (SUTURE) ×3
SUT VIC AB 2-0 CT1 27 (SUTURE) ×6
SUT VIC AB 2-0 CT1 TAPERPNT 27 (SUTURE) ×3 IMPLANT
SUTURE STRATFX 0 PDS 27 VIOLET (SUTURE) ×1 IMPLANT
TRAY FOLEY CATH 14FR (SET/KITS/TRAYS/PACK) ×3 IMPLANT
WRAP KNEE MAXI GEL POST OP (GAUZE/BANDAGES/DRESSINGS) ×3 IMPLANT
YANKAUER SUCT BULB TIP 10FT TU (MISCELLANEOUS) ×3 IMPLANT

## 2017-09-09 NOTE — Discharge Instructions (Addendum)
Dr. Gaynelle Arabian Total Joint Specialist [facility] [address], [place], [place] [address] [phone]  TOTAL KNEE REPLACEMENT POSTOPERATIVE DIRECTIONS  Knee Rehabilitation, Guidelines Following Surgery  Results after knee surgery are often greatly improved when you follow the exercise, range of motion and muscle strengthening exercises prescribed by your doctor. Safety measures are also important to protect the knee from further injury. Any time any of these exercises cause you to have increased pain or swelling in your knee joint, decrease the amount until you are comfortable again and slowly increase them. If you have problems or questions, call your caregiver or physical therapist for advice.   HOME CARE INSTRUCTIONS  Remove items at home which could result in a fall. This includes throw rugs or furniture in walking pathways.   ICE to the affected knee every three hours for 30 minutes at a time and then as needed for pain and swelling.  Continue to use ice on the knee for pain and swelling from surgery. You may notice swelling that will progress down to the foot and ankle.  This is normal after surgery.  Elevate the leg when you are not up walking on it.    Continue to use the breathing machine which will help keep your temperature down.  It is common for your temperature to cycle up and down following surgery, especially at night when you are not up moving around and exerting yourself.  The breathing machine keeps your lungs expanded and your temperature down.  Do not place pillow under knee, focus on keeping the knee straight while resting  DIET You may resume your previous home diet once your are discharged from the hospital.  DRESSING / WOUND CARE / SHOWERING You may change your dressing every day with sterile gauze.  Please use good hand washing techniques before changing the dressing.  Do not use any lotions or creams on the incision until instructed by your  surgeon. You may start showering once you are discharged home but do not submerge the incision under water. Just pat the incision dry and apply a dry gauze dressing on daily. Change the surgical dressing daily and reapply a dry dressing each time.  ACTIVITY Walk with your walker as instructed. Use walker as long as suggested by your caregivers. Avoid periods of inactivity such as sitting longer than an hour when not asleep. This helps prevent blood clots.  You may resume a sexual relationship in one month or when given the OK by your doctor.  You may return to work once you are cleared by your doctor.  Do not drive a car for 6 weeks or until released by you surgeon.  Do not drive while taking narcotics.  WEIGHT BEARING Weight bearing as tolerated with assist device (walker, cane, etc) as directed, use it as long as suggested by your surgeon or therapist, typically at least 4-6 weeks.  POSTOPERATIVE CONSTIPATION PROTOCOL Constipation - defined medically as fewer than three stools per week and severe constipation as less than one stool per week.  One of the most common issues patients have following surgery is constipation.  Even if you have a regular bowel pattern at home, your normal regimen is likely to be disrupted due to multiple reasons following surgery.  Combination of anesthesia, postoperative narcotics, change in appetite and fluid intake all can affect your bowels.  In order to avoid complications following surgery, here are some recommendations in order to help you during your recovery period.  Colace (docusate) - Pick up an over-the-counter form of Colace or another stool softener and take twice a day as long as you are requiring postoperative pain medications.  Take with a full glass of water daily.  If you experience loose stools or diarrhea, hold the colace until you stool forms back up.  If your symptoms do not get better within 1 week or if they get worse, check with your  doctor.  Dulcolax (bisacodyl) - Pick up over-the-counter and take as directed by the product packaging as needed to assist with the movement of your bowels.  Take with a full glass of water.  Use this product as needed if not relieved by Colace only.   MiraLax (polyethylene glycol) - Pick up over-the-counter to have on hand.  MiraLax is a solution that will increase the amount of water in your bowels to assist with bowel movements.  Take as directed and can mix with a glass of water, juice, soda, coffee, or tea.  Take if you go more than two days without a movement. Do not use MiraLax more than once per day. Call your doctor if you are still constipated or irregular after using this medication for 7 days in a row.  If you continue to have problems with postoperative constipation, please contact the office for further assistance and recommendations.  If you experience "the worst abdominal pain ever" or develop nausea or vomiting, please contact the office immediatly for further recommendations for treatment.  ITCHING  If you experience itching with your medications, try taking only a single pain pill, or even half a pain pill at a time.  You can also use Benadryl over the counter for itching or also to help with sleep.   TED HOSE STOCKINGS Wear the elastic stockings on both legs for three weeks following surgery during the day but you may remove then at night for sleeping.  MEDICATIONS See your medication summary on the After Visit Summary that the nursing staff will review with you prior to discharge.  You may have some home medications which will be placed on hold until you complete the course of blood thinner medication.  It is important for you to complete the blood thinner medication as prescribed by your surgeon.  Continue your approved medications as instructed at time of discharge.  PRECAUTIONS If you experience chest pain or shortness of breath - call 911 immediately for transfer to the  hospital emergency department.  If you develop a fever greater that 101 F, purulent drainage from wound, increased redness or drainage from wound, foul odor from the wound/dressing, or calf pain - CONTACT YOUR SURGEON.                                                   FOLLOW-UP APPOINTMENTS Make sure you keep all of your appointments after your operation with your surgeon and caregivers. You should call the office at the above phone number and make an appointment for approximately two weeks after the date of your surgery or on the date instructed by your surgeon outlined in the "After Visit Summary".   RANGE OF MOTION AND STRENGTHENING EXERCISES  Rehabilitation of the knee is important following a knee injury or an operation. After just a few days of immobilization, the muscles of the thigh which control the knee become weakened and  shrink (atrophy). Knee exercises are designed to build up the tone and strength of the thigh muscles and to improve knee motion. Often times heat used for twenty to thirty minutes before working out will loosen up your tissues and help with improving the range of motion but do not use heat for the first two weeks following surgery. These exercises can be done on a training (exercise) mat, on the floor, on a table or on a bed. Use what ever works the best and is most comfortable for you Knee exercises include:  Leg Lifts - While your knee is still immobilized in a splint or cast, you can do straight leg raises. Lift the leg to 60 degrees, hold for 3 sec, and slowly lower the leg. Repeat 10-20 times 2-3 times daily. Perform this exercise against resistance later as your knee gets better.  Quad and Hamstring Sets - Tighten up the muscle on the front of the thigh (Quad) and hold for 5-10 sec. Repeat this 10-20 times hourly. Hamstring sets are done by pushing the foot backward against an object and holding for 5-10 sec. Repeat as with quad sets.   Leg Slides: Lying on your back,  slowly slide your foot toward your buttocks, bending your knee up off the floor (only go as far as is comfortable). Then slowly slide your foot back down until your leg is flat on the floor again.  Angel Wings: Lying on your back spread your legs to the side as far apart as you can without causing discomfort.  A rehabilitation program following serious knee injuries can speed recovery and prevent re-injury in the future due to weakened muscles. Contact your doctor or a physical therapist for more information on knee rehabilitation.   IF YOU ARE TRANSFERRED TO A SKILLED REHAB FACILITY If the patient is transferred to a skilled rehab facility following release from the hospital, a list of the current medications will be sent to the facility for the patient to continue.  When discharged from the skilled rehab facility, please have the facility set up the patient's [place] prior to being released. Also, the skilled facility will be responsible for providing the patient with their medications at time of release from the facility to include their pain medication, the muscle relaxants, and their blood thinner medication. If the patient is still at the rehab facility at time of the two week follow up appointment, the skilled rehab facility will also need to assist the patient in arranging follow up appointment in our office and any transportation needs.  MAKE SURE YOU:  Understand these instructions.  Get help right away if you are not doing well or get worse.    Pick up stool softner and laxative for home use following surgery while on pain medications. Do not submerge incision under water. Please use good hand washing techniques while changing dressing each day. May shower starting three days after surgery. Please use a clean towel to pat the incision dry following showers. Continue to use ice for pain and swelling after surgery. Do not use any lotions or creams on the incision  until instructed by your surgeon.  Information on my medicine - XARELTO (Rivaroxaban)  Why was Xarelto prescribed for you? Xarelto was prescribed for you to reduce the risk of blood clots forming after orthopedic surgery. The medical term for these abnormal blood clots is venous thromboembolism (VTE).  What do you need to know about xarelto ? Take your Xarelto ONCE DAILY  at the same time every day. You may take it either with or without food.  If you have difficulty swallowing the tablet whole, you may crush it and mix in applesauce just prior to taking your dose.  Take Xarelto exactly as prescribed by your doctor and DO NOT stop taking Xarelto without talking to the doctor who prescribed the medication.  Stopping without other VTE prevention medication to take the place of Xarelto may increase your risk of developing a clot.  After discharge, you should have regular check-up appointments with your healthcare provider that is prescribing your Xarelto.    What do you do if you miss a dose? If you miss a dose, take it as soon as you remember on the same day then continue your regularly scheduled once daily regimen the next day. Do not take two doses of Xarelto on the same day.   Important Safety Information A possible side effect of Xarelto is bleeding. You should call your healthcare provider right away if you experience any of the following: ? Bleeding from an injury or your nose that does not stop. ? Unusual colored urine (red or dark brown) or unusual colored stools (red or black). ? Unusual bruising for unknown reasons. ? A serious fall or if you hit your head (even if there is no bleeding).  Some medicines may interact with Xarelto and might increase your risk of bleeding while on Xarelto. To help avoid this, consult your healthcare provider or pharmacist prior to using any new prescription or non-prescription medications, including herbals, vitamins, non-steroidal  anti-inflammatory drugs (NSAIDs) and supplements.  This website has more information on Xarelto: [email or web address]

## 2017-09-09 NOTE — Anesthesia Procedure Notes (Signed)
Date/Time: 09/09/2017 2:27 PM Performed by: Dione Booze, CRNA Pre-anesthesia Checklist: Patient identified, Emergency Drugs available, Suction available and Patient being monitored Patient Re-evaluated:Patient Re-evaluated prior to induction Oxygen Delivery Method: Simple face mask Placement Confirmation: positive ETCO2

## 2017-09-09 NOTE — Progress Notes (Signed)
AssistedDr. Conrad Saxman with left, ultrasound guided, adductor canal block. Side rails up, monitors on throughout procedure. See vital signs in flow sheet. Tolerated Procedure well.

## 2017-09-09 NOTE — Op Note (Signed)
OPERATIVE REPORT-TOTAL KNEE ARTHROPLASTY   Pre-operative diagnosis- Osteoarthritis  Left knee(s)  Post-operative diagnosis- Osteoarthritis Left knee(s)  Procedure-  Left  Total Knee Arthroplasty (Depuy Attune)  Surgeon- [name]. Angeleena Dueitt, MD  Assistant- Theresa Duty, PA-C   Anesthesia-  GA combined with regional for post-op pain  EBL- 25 ml   Drains Hemovac  Tourniquet time-  Total Tourniquet Time Documented: Thigh (Left) - 33 minutes Total: Thigh (Left) - 33 minutes     Complications- None  Condition-PACU - hemodynamically stable.   Brief Clinical Note  Debbie Floyd is a 62 y.o. year old female with end stage OA of her left knee with progressively worsening pain and dysfunction. She has constant pain, with activity and at rest and significant functional deficits with difficulties even with ADLs. She has had extensive non-op management including analgesics, injections of cortisone and viscosupplements, and home exercise program, but remains in significant pain with significant dysfunction. Radiographs show bone on bone arthritis medial and patellofemoral. She presents now for left Total Knee Arthroplasty.    Procedure in detail---   The patient is brought into the operating room and positioned supine on the operating table. After successful administration of  GA combined with regional for post-op pain,   a tourniquet is placed high on the  Left thigh(s) and the lower extremity is prepped and draped in the usual sterile fashion. Time out is performed by the operating team and then the  Left lower extremity is wrapped in Esmarch, knee flexed and the tourniquet inflated to 300 mmHg.       A midline incision is made with a ten blade through the subcutaneous tissue to the level of the extensor mechanism. A fresh blade is used to make a medial parapatellar arthrotomy. Soft tissue over the proximal medial tibia is subperiosteally elevated to the joint line with a knife and into the  semimembranosus bursa with a Cobb elevator. Soft tissue over the proximal lateral tibia is elevated with attention being paid to avoiding the patellar tendon on the tibial tubercle. The patella is everted, knee flexed 90 degrees and the ACL and PCL are removed. Findings are bone on bone medial and patellofemoral with large medial osteophytes.        The drill is used to create a starting hole in the distal femur and the canal is thoroughly irrigated with sterile saline to remove the fatty contents. The 5 degree Left  valgus alignment guide is placed into the femoral canal and the distal femoral cutting block is pinned to remove 9 mm off the distal femur. Resection is made with an oscillating saw.      The tibia is subluxed forward and the menisci are removed. The extramedullary alignment guide is placed referencing proximally at the medial aspect of the tibial tubercle and distally along the second metatarsal axis and tibial crest. The block is pinned to remove 8m off the more deficient medial  side. Resection is made with an oscillating saw. Size 3is the most appropriate size for the tibia and the proximal tibia is prepared with the modular drill and keel punch for that size.      The femoral sizing guide is placed and size 4 is most appropriate. Rotation is marked off the epicondylar axis and confirmed by creating a rectangular flexion gap at 90 degrees. The size 4 cutting block is pinned in this rotation and the anterior, posterior and chamfer cuts are made with the oscillating saw. The intercondylar block is then  placed and that cut is made.      Trial size 3 tibial component, trial size 4 narrow posterior stabilized femur and a 10  mm posterior stabilized rotating platform insert trial is placed. Full extension is achieved with excellent varus/valgus and anterior/posterior balance throughout full range of motion. The patella is everted and thickness measured to be 22  mm. Free hand resection is taken to 12  mm, a 32 template is placed, lug holes are drilled, trial patella is placed, and it tracks normally. Osteophytes are removed off the posterior femur with the trial in place. All trials are removed and the cut bone surfaces prepared with pulsatile lavage. Cement is mixed and once ready for implantation, the size 3 tibial implant, size  4 narrow posterior stabilized femoral component, and the size 32 patella are cemented in place and the patella is held with the clamp. The trial insert is placed and the knee held in full extension. The Exparel (20 ml mixed with 60 ml saline) is injected into the extensor mechanism, posterior capsule, medial and lateral gutters and subcutaneous tissues.  All extruded cement is removed and once the cement is hard the permanent 8 mm posterior stabilized rotating platform insert is placed into the tibial tray.      The wound is copiously irrigated with saline solution and the extensor mechanism closed over a hemovac drain with #1 V-loc suture. The tourniquet is released for a total tourniquet time of 33  minutes. Flexion against gravity is 140 degrees and the patella tracks normally. Subcutaneous tissue is closed with 2.0 vicryl and subcuticular with running 4.0 Monocryl. The incision is cleaned and dried and steri-strips and a bulky sterile dressing are applied. The limb is placed into a knee immobilizer and the patient is awakened and transported to recovery in stable condition.      Please note that a surgical assistant was a medical necessity for this procedure in order to perform it in a safe and expeditious manner. Surgical assistant was necessary to retract the ligaments and vital neurovascular structures to prevent injury to them and also necessary for proper positioning of the limb to allow for anatomic placement of the prosthesis.   [name] Israa Caban, MD    09/09/2017, 3:42 PM

## 2017-09-09 NOTE — Interval H&P Note (Signed)
History and Physical Interval Note:  09/09/2017 12:07 PM  Debbie Floyd  has presented today for surgery, with the diagnosis of left knee osteoarthritis  The various methods of treatment have been discussed with the patient and family. After consideration of risks, benefits and other options for treatment, the patient has consented to  Procedure(s): LEFT TOTAL KNEE ARTHROPLASTY (Left) as a surgical intervention .  The patient's history has been reviewed, patient examined, no change in status, stable for surgery.  I have reviewed the patient's chart and labs.  Questions were answered to the patient's satisfaction.     Pilar Plate Karson Reede

## 2017-09-09 NOTE — Anesthesia Procedure Notes (Signed)
Anesthesia Regional Block: Adductor canal block   Pre-Anesthetic Checklist: ,, timeout performed, Correct Patient, Correct Site, Correct Laterality, Correct Procedure, Correct Position, site marked, Risks and benefits discussed,  Surgical consent,  Pre-op evaluation,  At surgeon's request and post-op pain management  Laterality: Left  Prep: chloraprep       Needles:  Injection technique: Single-shot  Needle Type: Echogenic Stimulator Needle     Needle Length: 9cm  Needle Gauge: 21     Additional Needles:   Narrative:  Start time: 09/09/2017 1:55 PM End time: 09/09/2017 2:05 PM Injection made incrementally with aspirations every 5 mL.  Performed by: Personally  Anesthesiologist: Lillia Abed, MD  Additional Notes: Monitors applied. Patient sedated. Sterile prep and drape,hand hygiene and sterile gloves were used. Relevant anatomy identified.Needle position confirmed.Local anesthetic injected incrementally after negative aspiration. Local anesthetic spread visualized around nerve(s). Vascular puncture avoided. No complications. Image printed for medical record.The patient tolerated the procedure well.    Lillia Abed MD

## 2017-09-09 NOTE — Anesthesia Postprocedure Evaluation (Signed)
Anesthesia Post Note  Patient: Debbie Floyd  Procedure(s) Performed: LEFT TOTAL KNEE ARTHROPLASTY (Left Knee)     Patient location during evaluation: PACU Anesthesia Type: General Level of consciousness: awake and alert Pain management: pain level controlled Vital Signs Assessment: post-procedure vital signs reviewed and stable Respiratory status: spontaneous breathing, nonlabored ventilation, respiratory function stable and patient connected to nasal cannula oxygen Cardiovascular status: blood pressure returned to baseline and stable Postop Assessment: no apparent nausea or vomiting Anesthetic complications: no    Last Vitals:  Vitals:   09/09/17 1645 09/09/17 1659  BP: 132/67 130/70  Pulse: 65 (!) 55  Resp: 12 16  Temp: (!) 36.4 C (!) 36.3 C  SpO2: 100% 100%    Last Pain:  Vitals:   09/09/17 1645  TempSrc:   PainSc: 4                  Justiss Gerbino DAVID

## 2017-09-09 NOTE — Anesthesia Procedure Notes (Signed)
Procedure Name: LMA Insertion Date/Time: 09/09/2017 2:44 PM Performed by: Dione Booze, CRNA Pre-anesthesia Checklist: Patient identified, Emergency Drugs available, Suction available and Patient being monitored Patient Re-evaluated:Patient Re-evaluated prior to induction Oxygen Delivery Method: Circle system utilized Preoxygenation: Pre-oxygenation with 100% oxygen Induction Type: IV induction LMA: LMA inserted LMA Size: 4.0 Number of attempts: 1 Placement Confirmation: positive ETCO2 and breath sounds checked- equal and bilateral Tube secured with: Tape Dental Injury: Teeth and Oropharynx as per pre-operative assessment

## 2017-09-09 NOTE — Anesthesia Preprocedure Evaluation (Signed)
Anesthesia Evaluation  Patient identified by MRN, date of birth, ID band Patient awake    Reviewed: Allergy & Precautions, NPO status , Patient's Chart, lab work & pertinent test results  Airway Mallampati: I  TM Distance: >3 FB Neck ROM: Full    Dental   Pulmonary    Pulmonary exam normal        Cardiovascular Normal cardiovascular exam     Neuro/Psych    GI/Hepatic GERD  Medicated and Controlled,Ulcerative collitis   Endo/Other    Renal/GU      Musculoskeletal   Abdominal   Peds  Hematology   Anesthesia Other Findings   Reproductive/Obstetrics                             Anesthesia Physical Anesthesia Plan  ASA: II  Anesthesia Plan: Spinal   Post-op Pain Management:  Regional for Post-op pain   Induction: Intravenous  PONV Risk Score and Plan: 2 and Ondansetron and Midazolam  Airway Management Planned: Simple Face Mask  Additional Equipment:   Intra-op Plan:   Post-operative Plan:   Informed Consent: I have reviewed the patients History and Physical, chart, labs and discussed the procedure including the risks, benefits and alternatives for the proposed anesthesia with the patient or authorized representative who has indicated his/her understanding and acceptance.     Plan Discussed with: CRNA and Surgeon  Anesthesia Plan Comments:         Anesthesia Quick Evaluation

## 2017-09-09 NOTE — Transfer of Care (Signed)
Immediate Anesthesia Transfer of Care Note  Patient: Debbie Floyd  Procedure(s) Performed: LEFT TOTAL KNEE ARTHROPLASTY (Left Knee)  Patient Location: PACU  Anesthesia Type:General and Regional  Level of Consciousness: awake and patient cooperative  Airway & Oxygen Therapy: Patient Spontanous Breathing and Patient connected to face mask oxygen  Post-op Assessment: Report given to RN and Post -op Vital signs reviewed and stable  Post vital signs: Reviewed and stable  Last Vitals:  Vitals Value Taken Time  BP 154/69 09/09/2017  4:10 PM  Temp    Pulse 67 09/09/2017  4:11 PM  Resp 13 09/09/2017  4:11 PM  SpO2 98 % 09/09/2017  4:11 PM  Vitals shown include unvalidated device data.  Last Pain:  Vitals:   09/09/17 1402  TempSrc:   PainSc: 0-No pain      Patients Stated Pain Goal: 4 (72/25/75 0518)  Complications: No apparent anesthesia complications

## 2017-09-10 ENCOUNTER — Encounter (HOSPITAL_COMMUNITY): Payer: Self-pay | Admitting: Orthopedic Surgery

## 2017-09-10 LAB — CBC
HCT: 40 % (ref 36.0–46.0)
HEMOGLOBIN: 13.5 g/dL (ref 12.0–15.0)
MCH: 31.3 pg (ref 26.0–34.0)
MCHC: 33.8 g/dL (ref 30.0–36.0)
MCV: 92.8 fL (ref 78.0–100.0)
Platelets: 274 10*3/uL (ref 150–400)
RBC: 4.31 MIL/uL (ref 3.87–5.11)
RDW: 13.5 % (ref 11.5–15.5)
WBC: 8.2 10*3/uL (ref 4.0–10.5)

## 2017-09-10 LAB — BASIC METABOLIC PANEL
ANION GAP: 6 (ref 5–15)
BUN: 11 mg/dL (ref 6–20)
CHLORIDE: 103 mmol/L (ref 98–111)
CO2: 26 mmol/L (ref 22–32)
Calcium: 8.5 mg/dL — ABNORMAL LOW (ref 8.9–10.3)
Creatinine, Ser: 0.56 mg/dL (ref 0.44–1.00)
GFR calc Af Amer: >60 mL/min (ref >60)
GLUCOSE: 148 mg/dL — AB (ref 70–99)
POTASSIUM: 4.1 mmol/L (ref 3.5–5.1)
Sodium: 135 mmol/L (ref 135–145)

## 2017-09-10 NOTE — Evaluation (Addendum)
Physical Therapy Evaluation Patient Details Name: Debbie Floyd MRN: [id number] DOB: November 12, 1955 Today's Date: 09/10/2017   History of Present Illness  Ptis a 62 y.o. female s/p Lt TKA 09/09/2017 without significant other PMH.   Clinical Impression  Pt is s/p TKA resulting in the deficits listed below (see PT Problem List). Patient performed well requiring minimal assist for bed mobility, transfers and ambulation with RW largely due to verbal cues for hand placement, sequencing of steps and correcting gait pattern. Patient does have 2 steps to enter her home but husband is able to assist as needed. Pt reports she has an OPPT appt this Thursday. Pt will benefit from skilled PT to increase their independence and safety with mobility to allow discharge to the venue listed below.      Follow Up Recommendations Follow surgeon's recommendation for DC plan and follow-up therapies    Equipment Recommendations  None recommended by PT    Recommendations for Other Services       Precautions / Restrictions Restrictions Weight Bearing Restrictions: Yes LLE Weight Bearing: Weight bearing as tolerated      Mobility  Bed Mobility Overal bed mobility: Needs Assistance Bed Mobility: Supine to Sit     Supine to sit: Min assist        Transfers Overall transfer level: Needs assistance Equipment used: Rolling walker (2 wheeled) Transfers: Sit to/from [facility] Sit to Stand: Min assist Stand pivot transfers: Min assist       General transfer comment: verbal cues to hand placement and sequencing of steps  Ambulation/Gait Ambulation/Gait assistance: Min guard Gait Distance (Feet): 100 Feet Assistive device: Rolling walker (2 wheeled) Gait Pattern/deviations: Step-through pattern;Decreased step length - right;Decreased stance time - left;Decreased stride length;Decreased dorsiflexion - left;Decreased weight shift to left Gait velocity: decr   General Gait Details: verbal  cues to knee flexion during swing through phase of gait.   Stairs            Wheelchair Mobility    Modified Rankin (Stroke Patients Only)       Balance Overall balance assessment: Needs assistance Sitting-balance support: No upper extremity supported;Feet supported Sitting balance-Leahy Scale: Good     Standing balance support: Bilateral upper extremity supported;During functional activity Standing balance-Leahy Scale: Fair                               Pertinent Vitals/Pain Pain Assessment: 0-10 Pain Score: 3  Pain Descriptors / Indicators: Aching;Throbbing Pain Intervention(s): Limited activity within patient's tolerance;Monitored during session;Repositioned    Home Living Family/patient expects to be discharged to:: Private residence Living Arrangements: Spouse/significant other Available Help at Discharge: Family Type of Home: House Home Access: Stairs to enter Entrance Stairs-Rails: None Entrance Stairs-Number of Steps: 2 Home Layout: One level Home Equipment: Environmental consultant - 2 wheels;Walker - 4 wheels      Prior Function Level of Independence: Independent with assistive device(s)               Hand Dominance        Extremity/Trunk Assessment   Upper Extremity Assessment Upper Extremity Assessment: Overall WFL for tasks assessed    Lower Extremity Assessment Lower Extremity Assessment: Overall WFL for tasks assessed;LLE deficits/detail LLE Deficits / Details: weakness expectted s/p lt TKA.    Cervical / Trunk Assessment Cervical / Trunk Assessment: Normal  Communication   Communication: No difficulties  Cognition Arousal/Alertness: Awake/alert Behavior During Therapy: WFL for tasks  assessed/performed Overall Cognitive Status: Within Functional Limits for tasks assessed                                        General Comments      Exercises Total Joint Exercises Ankle Circles/Pumps: AROM;Strengthening;Both;10  reps Heel Slides: AROM;Strengthening;Left;10 reps;Seated   Assessment/Plan    PT Assessment Patient needs continued PT services  PT Problem List Decreased strength;Pain;Decreased range of motion;Decreased activity tolerance;Decreased knowledge of use of DME;Decreased balance;Decreased mobility;Decreased skin integrity       PT Treatment Interventions DME instruction;Therapeutic activities;Gait training;Therapeutic exercise;Patient/family education;Balance training    PT Goals (Current goals can be found in the Care Plan section)  Acute Rehab PT Goals Patient Stated Goal: go home with husband to help. PT Goal Formulation: With patient/family Time For Goal Achievement: 09/17/17 Potential to Achieve Goals: Good    Frequency 7X/week   Barriers to discharge        Co-evaluation               AM-PAC PT "6 Clicks" Daily Activity  Outcome Measure Difficulty turning over in bed (including adjusting bedclothes, sheets and blankets)?: A Little Difficulty moving from lying on back to sitting on the side of the bed? : A Little Difficulty sitting down on and standing up from a chair with arms (e.g., wheelchair, bedside commode, etc,.)?: A Little Help needed moving to and from a bed to chair (including a wheelchair)?: A Little Help needed walking in hospital room?: A Little Help needed climbing 3-5 steps with a railing? : A Little 6 Click Score: 18    End of Session Equipment Utilized During Treatment: Gait belt Activity Tolerance: Patient tolerated treatment well Patient left: in chair;with call bell/phone within reach;with family/visitor present Nurse Communication: Mobility status PT Visit Diagnosis: Other abnormalities of gait and mobility (R26.89);Difficulty in walking, not elsewhere classified (R26.2);Muscle weakness (generalized) (M62.81)    Time: 6283-1517 PT Time Calculation (min) (ACUTE ONLY): 31 min   Charges:   PT Evaluation $PT Eval Low Complexity: 1 Low PT  Treatments $Gait Training: 8-22 mins   PT G Codes:        Latise Dilley D. Hartnett-Rands, MS, PT Per [place] [phone] 09/10/2017, 12:40 PM

## 2017-09-10 NOTE — Progress Notes (Signed)
   Subjective: 1 Day Post-Op Procedure(s) (LRB): LEFT TOTAL KNEE ARTHROPLASTY (Left) Patient reports pain as mild.   Patient seen in rounds by Dr. Wynelle Link. Patient is well, and has had no acute complaints or problems other than pain in the left knee. Foley catheter removed this AM. Denies chest pain, SOB, or calf pain. We will start therapy today.   Objective: Vital signs in last 24 hours: Temp:  [97.4 F (36.3 C)-99.8 F (37.7 C)] 98.1 F (36.7 C) (07/09 0607) Pulse Rate:  [53-84] 61 (07/09 0607) Resp:  [4-26] 16 (07/09 0607) BP: (108-169)/(62-97) 111/62 (07/09 0607) SpO2:  [88 %-100 %] 99 % (07/09 0607) Weight:  [73 kg (161 lb)] 73 kg (161 lb) (07/08 1200)  Intake/Output from previous day:  Intake/Output Summary (Last 24 hours) at 09/10/2017 0725 Last data filed at 09/10/2017 0614 Gross per 24 hour  Intake 3867.67 ml  Output 1500 ml  Net 2367.67 ml    Labs: Recent Labs    09/10/17 0518  HGB 13.5   Recent Labs    09/10/17 0518  WBC 8.2  RBC 4.31  HCT 40.0  PLT 274   Recent Labs    09/10/17 0518  NA 135  K 4.1  CL 103  CO2 26  BUN 11  CREATININE 0.56  GLUCOSE 148*  CALCIUM 8.5*   Exam: General - Patient is Alert and Oriented Extremity - Neurologically intact Neurovascular intact Sensation intact distally Dorsiflexion/Plantar flexion intact Dressing - dressing C/D/I Motor Function - intact, moving foot and toes well on exam.   Past Medical History:  Diagnosis Date  . Arthritis    RIGHT KNEE  . Breast cancer ([place]) 2014   lumpectomy lt  . Cancer of left breast ([place]) 09/14/2014  . Dysrhythmia    PALPITATIONS-WELL CONTROLLED ON METOPROLOL (07-19-15)  . GERD (gastroesophageal reflux disease)   . Hypercholesterolemia   . Overactive bladder     Assessment/Plan: 1 Day Post-Op Procedure(s) (LRB): LEFT TOTAL KNEE ARTHROPLASTY (Left) Principal Problem:   OA (osteoarthritis) of knee  Estimated body mass index is 26.79 kg/m as calculated from the  following:   Height as of this encounter: 5' 5"  (1.651 m).   Weight as of this encounter: 73 kg (161 lb). Advance diet Up with therapy  Anticipated LOS equal to or greater than 2 midnights due to - Age 63 and older with one or more of the following:  - Obesity  - Expected need for hospital services (PT, OT, Nursing) required for safe  discharge  - Anticipated need for postoperative skilled nursing care or inpatient rehab  - Active co-morbidities: None OR   - Unanticipated findings during/Post Surgery: None  - Patient is a high risk of re-admission due to: None    DVT Prophylaxis - Xarelto Weight bearing as tolerated. D/C O2 and pulse ox and try on room air. Hemovac pulled without difficulty, will continue working with therapy today.  Plan is to go Home after hospital stay. Possible discharge to home tomorrow if meeting goals and stable.   Theresa Duty, PA-C Orthopedic Surgery 09/10/2017, 7:25 AM

## 2017-09-10 NOTE — Progress Notes (Signed)
Physical Therapy Treatment Patient Details Name: Debbie Floyd MRN: [id number] DOB: 07/20/1955 Today's Date: 09/10/2017    History of Present Illness Ptis a 62 y.o. female s/p Lt TKA 09/09/2017 without significant other PMH.     PT Comments    Patient able to go up and down 4 stairs without a handrail with min to mod assist 2 hands held. Husband present for instruction verbalizing understanding. Patient is making good progress with PT.  From a mobility standpoint anticipate patient will be ready for DC home 09/10/2017.      Follow Up Recommendations  Follow surgeon's recommendation for DC plan and follow-up therapies     Equipment Recommendations  None recommended by PT    Recommendations for Other Services       Precautions / Restrictions Restrictions Weight Bearing Restrictions: Yes LLE Weight Bearing: Weight bearing as tolerated    Mobility  Bed Mobility Overal bed mobility: Needs Assistance Bed Mobility: Sit to Supine     Supine to sit: Min assist Sit to supine: Min guard      Transfers Overall transfer level: Needs assistance Equipment used: Rolling walker (2 wheeled) Transfers: Sit to/from [facility] Sit to Stand: Min assist;Min guard Stand pivot transfers: Min assist;Min guard       General transfer comment: verbal cues to hand placement and sequencing of steps  Ambulation/Gait Ambulation/Gait assistance: Min guard Gait Distance (Feet): 200 Feet Assistive device: Rolling walker (2 wheeled) Gait Pattern/deviations: Step-through pattern;Decreased step length - right;Decreased stance time - left;Decreased stride length;Decreased dorsiflexion - left;Decreased weight shift to left Gait velocity: decr   General Gait Details: verbal cues to knee flexion during swing through phase of gait.    Stairs Stairs: Yes Stairs assistance: Min assist Stair Management: No rails;Step to pattern;Forwards Number of Stairs: 4 General stair comments:  husband present and received instruction   Wheelchair Mobility    Modified Rankin (Stroke Patients Only)       Balance Overall balance assessment: Needs assistance Sitting-balance support: No upper extremity supported;Feet supported Sitting balance-Leahy Scale: Good     Standing balance support: Bilateral upper extremity supported;During functional activity Standing balance-Leahy Scale: Fair                              Cognition Arousal/Alertness: Awake/alert Behavior During Therapy: WFL for tasks assessed/performed Overall Cognitive Status: Within Functional Limits for tasks assessed                                        Exercises Total Joint Exercises Ankle Circles/Pumps: AROM;Strengthening;Both;10 reps Quad Sets: AROM;Strengthening;Left;10 reps;Supine Short Arc Quad: AROM;Strengthening;Left;10 reps;Supine Heel Slides: AROM;Strengthening;Left;10 reps;Supine Hip ABduction/ADduction: AROM;Strengthening;Left;10 reps;Supine Straight Leg Raises: Strengthening;Left;10 reps;Supine    General Comments        Pertinent Vitals/Pain Pain Assessment: 0-10 Pain Score: 3  Pain Descriptors / Indicators: Aching;Throbbing Pain Intervention(s): Limited activity within patient's tolerance;Monitored during session    No Name expects to be discharged to:: Private residence Living Arrangements: Spouse/significant other Available Help at Discharge: Family Type of Home: House Home Access: Stairs to enter Entrance Stairs-Rails: None Home Layout: One level Home Equipment: Environmental consultant - 2 wheels;Walker - 4 wheels      Prior Function Level of Independence: Independent with assistive device(s)          PT Goals (current goals can now be  found in the care plan section) Acute Rehab PT Goals Patient Stated Goal: go home with husband to help. PT Goal Formulation: With patient/family Time For Goal Achievement: 09/17/17 Potential to Achieve  Goals: Good Progress towards PT goals: Progressing toward goals    Frequency    7X/week      PT Plan Current plan remains appropriate    Co-evaluation              AM-PAC PT "6 Clicks" Daily Activity  Outcome Measure  Difficulty turning over in bed (including adjusting bedclothes, sheets and blankets)?: A Little Difficulty moving from lying on back to sitting on the side of the bed? : A Little Difficulty sitting down on and standing up from a chair with arms (e.g., wheelchair, bedside commode, etc,.)?: A Little Help needed moving to and from a bed to chair (including a wheelchair)?: A Little Help needed walking in hospital room?: A Little Help needed climbing 3-5 steps with a railing? : A Little 6 Click Score: 18    End of Session Equipment Utilized During Treatment: Gait belt Activity Tolerance: Patient tolerated treatment well Patient left: in chair;with call bell/phone within reach;with family/visitor present Nurse Communication: Mobility status PT Visit Diagnosis: Other abnormalities of gait and mobility (R26.89);Difficulty in walking, not elsewhere classified (R26.2);Muscle weakness (generalized) (M62.81)     Time: 4356-8616 PT Time Calculation (min) (ACUTE ONLY): 30 min  Charges:  $Gait Training: 8-22 mins $Therapeutic Exercise: 8-22 mins                    G Codes:       Kaylyne Axton D. Hartnett-Rands, MS, PT Per [place] [phone] 09/10/2017, 3:42 PM

## 2017-09-11 LAB — CBC
HCT: 35.8 % — ABNORMAL LOW (ref 36.0–46.0)
HEMOGLOBIN: 11.9 g/dL — AB (ref 12.0–15.0)
MCH: 31.2 pg (ref 26.0–34.0)
MCHC: 33.2 g/dL (ref 30.0–36.0)
MCV: 93.7 fL (ref 78.0–100.0)
Platelets: 265 10*3/uL (ref 150–400)
RBC: 3.82 MIL/uL — ABNORMAL LOW (ref 3.87–5.11)
RDW: 14 % (ref 11.5–15.5)
WBC: 8.8 10*3/uL (ref 4.0–10.5)

## 2017-09-11 LAB — BASIC METABOLIC PANEL
ANION GAP: 7 (ref 5–15)
BUN: 13 mg/dL (ref 6–20)
CHLORIDE: 103 mmol/L (ref 98–111)
CO2: 28 mmol/L (ref 22–32)
CREATININE: 0.67 mg/dL (ref 0.44–1.00)
Calcium: 8.5 mg/dL — ABNORMAL LOW (ref 8.9–10.3)
GFR calc non Af Amer: >60 mL/min (ref >60)
Glucose, Bld: 119 mg/dL — ABNORMAL HIGH (ref 70–99)
Potassium: 3.9 mmol/L (ref 3.5–5.1)
SODIUM: 138 mmol/L (ref 135–145)

## 2017-09-11 MED ORDER — METHOCARBAMOL 500 MG PO TABS: 500.0000 mg | ORAL_TABLET | Freq: Four times a day (QID) | ORAL | 0 refills | Status: DC | PRN

## 2017-09-11 MED ORDER — RIVAROXABAN 10 MG PO TABS: 10.0000 mg | ORAL_TABLET | Freq: Every day | ORAL | 0 refills | Status: DC

## 2017-09-11 MED ORDER — TRAMADOL HCL 50 MG PO TABS: 50.0000 mg | ORAL_TABLET | Freq: Four times a day (QID) | ORAL | 0 refills | Status: DC | PRN

## 2017-09-11 MED ORDER — SODIUM CHLORIDE 0.9 % IV BOLUS
250.0000 mL | Freq: Once | INTRAVENOUS | Status: AC
  Administered 2017-09-11: 250 mL via INTRAVENOUS

## 2017-09-11 MED ORDER — OXYCODONE HCL 5 MG PO TABS: 5.0000 mg | ORAL_TABLET | Freq: Four times a day (QID) | ORAL | 0 refills | Status: DC | PRN

## 2017-09-11 NOTE — Progress Notes (Signed)
Patient ambulating to bathroom. Vitals stable. Minimal pain level. Vitals stable.

## 2017-09-11 NOTE — Progress Notes (Addendum)
   Subjective: 2 Days Post-Op Procedure(s) (LRB): LEFT TOTAL KNEE ARTHROPLASTY (Left) Patient reports pain as mild.   Patient seen in rounds with Dr. Wynelle Link. Patient is well, and has had no acute complaints or problems. Reports that she has minimal pain. No SOB or chest pain. Voiding well. Positive flatus. No dizziness.  Plan is to go Home after hospital stay.  Objective: Vital signs in last 24 hours: Temp:  [98.3 F (36.8 C)-98.5 F (36.9 C)] 98.4 F (36.9 C) (07/10 0423) Pulse Rate:  [51-77] 77 (07/10 0423) Resp:  [16-18] 16 (07/10 0423) BP: (90-123)/(52-61) 90/52 (07/10 0423) SpO2:  [91 %-100 %] 91 % (07/10 0423)  Intake/Output from previous day:  Intake/Output Summary (Last 24 hours) at 09/11/2017 0756 Last data filed at 09/11/2017 0423 Gross per 24 hour  Intake 1057.5 ml  Output 500 ml  Net 557.5 ml     Labs: Recent Labs    09/10/17 0518 09/11/17 0549  HGB 13.5 11.9*   Recent Labs    09/10/17 0518 09/11/17 0549  WBC 8.2 8.8  RBC 4.31 3.82*  HCT 40.0 35.8*  PLT 274 265   Recent Labs    09/10/17 0518 09/11/17 0549  NA 135 138  K 4.1 3.9  CL 103 103  CO2 26 28  BUN 11 13  CREATININE 0.56 0.67  GLUCOSE 148* 119*  CALCIUM 8.5* 8.5*    EXAM General - Patient is Alert and Oriented Extremity - Neurologically intact Neurovascular intact Intact pulses distally Dorsiflexion/Plantar flexion intact No cellulitis present Compartment soft Dressing/Incision - clean, dry, no drainage Motor Function - intact, moving foot and toes well on exam.   Past Medical History:  Diagnosis Date  . Arthritis    RIGHT KNEE  . Breast cancer ([place]) 2014   lumpectomy lt  . Cancer of left breast ([place]) 09/14/2014  . Dysrhythmia    PALPITATIONS-WELL CONTROLLED ON METOPROLOL (07-19-15)  . GERD (gastroesophageal reflux disease)   . Hypercholesterolemia   . Overactive bladder     Assessment/Plan: 2 Days Post-Op Procedure(s) (LRB): LEFT TOTAL KNEE ARTHROPLASTY  (Left) Principal Problem:   OA (osteoarthritis) of knee  Estimated body mass index is 26.79 kg/m as calculated from the following:   Height as of this encounter: 5' 5"  (1.651 m).   Weight as of this encounter: 73 kg (161 lb). Advance diet Up with therapy  DVT Prophylaxis - Xarelto Weight-Bearing as tolerated   Will have her continue with therapy this morning. Will give 250 bolus of fluid. If she remains asymptomatic and progresses with therapy, DC home with outpatient therapy.   Ardeen Jourdain, PA-C Orthopaedic Surgery 09/11/2017, 7:57 AM

## 2017-09-11 NOTE — Progress Notes (Signed)
Spoke with patient at bedside. Confirmed plan for OP PT, already arranged. Ordered RW, contacted [facility] to deliver to the room. [phone]

## 2017-09-11 NOTE — Progress Notes (Signed)
Physical Therapy Treatment Patient Details Name: Debbie Floyd MRN: [id number] DOB: March 03, 1956 Today's Date: 09/11/2017    History of Present Illness Ptis a 62 y.o. female s/p Lt TKA 09/09/2017 without significant other PMH.     PT Comments    Patient and husband received instruction on stairs again today. Patient is making good progress with PT.  From a mobility standpoint anticipate patient will be ready for DC home 09/11/2017.      Follow Up Recommendations  Follow surgeon's recommendation for DC plan and follow-up therapies     Equipment Recommendations  None recommended by PT    Recommendations for Other Services       Precautions / Restrictions Restrictions Weight Bearing Restrictions: No LLE Weight Bearing: Weight bearing as tolerated    Mobility  Bed Mobility Overal bed mobility: Needs Assistance Bed Mobility: Sit to Supine;Supine to Sit     Supine to sit: Supervision        Transfers Overall transfer level: Needs assistance Equipment used: Rolling walker (2 wheeled) Transfers: Sit to/from [employer] Transfers Sit to Stand: Min assist;Min guard Stand pivot transfers: Min assist;Min guard       General transfer comment: verbal cues to hand placement and sequencing of steps  Ambulation/Gait Ambulation/Gait assistance: Min guard Gait Distance (Feet): 250 Feet Assistive device: Rolling walker (2 wheeled) Gait Pattern/deviations: Step-through pattern;Decreased step length - right;Decreased stance time - left;Decreased stride length;Decreased dorsiflexion - left;Decreased weight shift to left Gait velocity: decr   General Gait Details: verbal cues to knee flexion during swing through phase of gait.    Stairs   Stairs assistance: Min assist Stair Management: No rails;Step to pattern;Forwards Number of Stairs: 4 General stair comments: husband present and received instruction   Wheelchair Mobility    Modified Rankin (Stroke Patients Only)       Balance Overall balance assessment: Needs assistance Sitting-balance support: No upper extremity supported;Feet supported Sitting balance-Leahy Scale: Good     Standing balance support: Bilateral upper extremity supported;During functional activity Standing balance-Leahy Scale: Fair                              Cognition Arousal/Alertness: Awake/alert Behavior During Therapy: WFL for tasks assessed/performed Overall Cognitive Status: Within Functional Limits for tasks assessed                                        Exercises Total Joint Exercises Ankle Circles/Pumps: AROM;Strengthening;Both;10 reps Quad Sets: AROM;Strengthening;Left;10 reps;Supine Short Arc Quad: AROM;Strengthening;Left;10 reps;Supine Heel Slides: AROM;Strengthening;Left;10 reps;Supine Hip ABduction/ADduction: AROM;Strengthening;Left;10 reps;Supine Straight Leg Raises: Strengthening;Left;10 reps;Supine    General Comments        Pertinent Vitals/Pain      Home Living Family/patient expects to be discharged to:: Private residence Living Arrangements: Spouse/significant other Available Help at Discharge: Family Type of Home: House Home Access: Stairs to enter Entrance Stairs-Rails: None Home Layout: One level Home Equipment: Environmental consultant - 4 wheels Additional Comments: RW borrowed from family is ergonomically too short for patient.    Prior Function Level of Independence: Independent with assistive device(s)          PT Goals (current goals can now be found in the care plan section) Progress towards PT goals: Progressing toward goals    Frequency    7X/week      PT Plan  Co-evaluation              AM-PAC PT "6 Clicks" Daily Activity  Outcome Measure  Difficulty turning over in bed (including adjusting bedclothes, sheets and blankets)?: A Little Difficulty moving from lying on back to sitting on the side of the bed? : A Little Difficulty sitting  down on and standing up from a chair with arms (e.g., wheelchair, bedside commode, etc,.)?: A Little Help needed moving to and from a bed to chair (including a wheelchair)?: A Little Help needed walking in hospital room?: A Little Help needed climbing 3-5 steps with a railing? : A Little 6 Click Score: 18    End of Session Equipment Utilized During Treatment: Gait belt Activity Tolerance: Patient tolerated treatment well Patient left: in chair;with call bell/phone within reach;with family/visitor present Nurse Communication: Mobility status PT Visit Diagnosis: Other abnormalities of gait and mobility (R26.89);Difficulty in walking, not elsewhere classified (R26.2);Muscle weakness (generalized) (M62.81)     Time: 7282-0601 PT Time Calculation (min) (ACUTE ONLY): 34 min  Charges:  $Gait Training: 8-22 mins $Therapeutic Exercise: 8-22 mins                    G Codes:       Noele Icenhour D. Hartnett-Rands, MS, PT Per [place] [phone] 09/11/2017, 11:28 AM

## 2017-09-12 NOTE — Discharge Summary (Signed)
Physician Discharge Summary   Patient ID: Debbie Floyd MRN: [id number] DOB/AGE: 60-01-1956 60 y.o.  Admit date: 09/09/2017 Discharge date: 09/11/2017  Primary Diagnosis: Primary osteoarthritis left knee  Admission Diagnoses:  Past Medical History:  Diagnosis Date  . Arthritis    RIGHT KNEE  . Breast cancer ([place]) 2014   lumpectomy lt  . Cancer of left breast ([place]) 09/14/2014  . Dysrhythmia    PALPITATIONS-WELL CONTROLLED ON METOPROLOL (07-19-15)  . GERD (gastroesophageal reflux disease)   . Hypercholesterolemia   . Overactive bladder    Discharge Diagnoses:   Principal Problem:   OA (osteoarthritis) of knee  Estimated body mass index is 26.79 kg/m as calculated from the following:   Height as of this encounter: 5' 5"  (1.651 m).   Weight as of this encounter: 73 kg (161 lb).  Procedure:  Procedure(s) (LRB): LEFT TOTAL KNEE ARTHROPLASTY (Left)   Consults: None  HPI: Debbie Floyd, 62 y.o. female, has a history of pain and functional disability in the left knee due to arthritis and has failed non-surgical conservative treatments for greater than 12 weeks to includeNSAID's and/or analgesics, corticosteriod injections, viscosupplementation injections and activity modification.  Onset of symptoms was gradual, starting several years ago with gradually worsening course since that time. The patient noted no past surgery on the left knee(s).  Patient currently rates pain in the left knee(s) at 7 out of 10 with activity. Patient has night pain, worsening of pain with activity and weight bearing and pain that interferes with activities of daily living.  Patient has evidence of bone on bone arthritis medial and patellofemoral on the left by imaging studies. There is no active infection.    Laboratory Data: Admission on 09/09/2017, Discharged on 09/11/2017  Component Date Value Ref Range Status  . ABO/RH(D) 09/09/2017 A POS   Final  . Antibody Screen 09/09/2017 NEG   Final  . Sample  Expiration 09/09/2017    Final                   Value:09/12/2017 Performed at [facility], [place] [address], [place], [place] [address]   . ABO/RH(D) 09/09/2017    Final                   Value:A POS Performed at [facility], [place] [address] [place], [place] [address]   . WBC 09/10/2017 8.2  4.0 - 10.5 K/uL Final  . RBC 09/10/2017 4.31  3.87 - 5.11 MIL/uL Final  . Hemoglobin 09/10/2017 13.5  12.0 - 15.0 g/dL Final  . HCT 09/10/2017 40.0  36.0 - 46.0 % Final  . MCV 09/10/2017 92.8  78.0 - 100.0 fL Final  . MCH 09/10/2017 31.3  26.0 - 34.0 pg Final  . MCHC 09/10/2017 33.8  30.0 - 36.0 g/dL Final  . RDW 09/10/2017 13.5  11.5 - 15.5 % Final  . Platelets 09/10/2017 274  150 - 400 K/uL Final   Performed at [facility], [place] [address] [place], [place] [address]  . Sodium 09/10/2017 135  135 - 145 mmol/L Final  . Potassium 09/10/2017 4.1  3.5 - 5.1 mmol/L Final  . Chloride 09/10/2017 103  98 - 111 mmol/L Final   Please note change in reference range.  . CO2 09/10/2017 26  22 - 32 mmol/L Final  . Glucose, Bld 09/10/2017 148* 70 - 99 mg/dL Final   Please note change in reference range.  . BUN 09/10/2017 11  6 - 20 mg/dL Final   Please note change in reference range.  . Creatinine, Ser 09/10/2017 0.56  0.44 - 1.00 mg/dL Final  . Calcium 09/10/2017 8.5* 8.9 - 10.3 mg/dL Final  . GFR calc non Af Amer 09/10/2017 >60  >60 mL/min Final  . GFR calc Af Amer 09/10/2017 >60  >60 mL/min Final   Comment: (NOTE) The eGFR has been calculated using the CKD EPI equation. This calculation has not been validated in all clinical situations. eGFR's persistently <60 mL/min signify possible Chronic Kidney Disease.   Georgiann Hahn gap 09/10/2017 6  5 - 15 Final   Performed at [facility], [place] [address] [place], [place] [address]  . WBC 09/11/2017 8.8  4.0 - 10.5 K/uL Final  . RBC 09/11/2017 3.82* 3.87 - 5.11 MIL/uL Final  .  Hemoglobin 09/11/2017 11.9* 12.0 - 15.0 g/dL Final  . HCT 09/11/2017 35.8* 36.0 - 46.0 % Final  . MCV 09/11/2017 93.7  78.0 - 100.0 fL Final  . MCH 09/11/2017 31.2  26.0 - 34.0 pg Final  . MCHC 09/11/2017 33.2  30.0 - 36.0 g/dL Final  . RDW 09/11/2017 14.0  11.5 - 15.5 % Final  . Platelets 09/11/2017 265  150 - 400 K/uL Final   Performed at [facility] [place] [address], [place], [place] [address]  . Sodium 09/11/2017 138  135 - 145 mmol/L Final  . Potassium 09/11/2017 3.9  3.5 - 5.1 mmol/L Final  . Chloride 09/11/2017 103  98 - 111 mmol/L Final   Please note change in reference range.  . CO2 09/11/2017 28  22 - 32 mmol/L Final  . Glucose, Bld 09/11/2017 119* 70 - 99 mg/dL Final   Please note change in reference range.  . BUN 09/11/2017 13  6 - 20 mg/dL Final   Please note change in reference range.  . Creatinine, Ser 09/11/2017 0.67  0.44 - 1.00 mg/dL Final  . Calcium 09/11/2017 8.5* 8.9 - 10.3 mg/dL Final  . GFR calc non Af Amer 09/11/2017 >60  >60 mL/min Final  . GFR calc Af Amer 09/11/2017 >60  >60 mL/min Final   Comment: (NOTE) The eGFR has been calculated using the CKD EPI equation. This calculation has not been validated in all clinical situations. eGFR's persistently <60 mL/min signify possible Chronic Kidney Disease.   Georgiann Hahn gap 09/11/2017 7  5 - 15 Final   Performed at [facility], [place] [address] [place], [place] [address]  Hospital Outpatient Visit on 08/27/2017  Component Date Value Ref Range Status  . aPTT 08/27/2017 28  24 - 36 seconds Final   Performed at [facility], [place] [address] [place], [place] [address]  . WBC 08/27/2017 6.2  4.0 - 10.5 K/uL Final  . RBC 08/27/2017 4.36  3.87 - 5.11 MIL/uL Final  . Hemoglobin 08/27/2017 13.6  12.0 - 15.0 g/dL Final  . HCT 08/27/2017 41.0  36.0 - 46.0 % Final  . MCV 08/27/2017 94.0  78.0 - 100.0 fL Final  . MCH 08/27/2017 31.2  26.0 - 34.0 pg Final  . MCHC  08/27/2017 33.2  30.0 - 36.0 g/dL Final  . RDW 08/27/2017 13.5  11.5 - 15.5 % Final  . Platelets 08/27/2017 275  150 - 400 K/uL Final   Performed at [facility], [place] [address], [place], [place] [address]  . Sodium 08/27/2017 142  135 - 145 mmol/L Final  . Potassium 08/27/2017 4.4  3.5 - 5.1 mmol/L Final  . Chloride 08/27/2017 109  98 - 111 mmol/L Final   Please note change in reference range.  . CO2 08/27/2017 28  22 - 32 mmol/L Final  . Glucose, Bld 08/27/2017 94  70 - 99 mg/dL Final   Please note change in reference range.  . BUN 08/27/2017 14  6 - 20 mg/dL Final   Please note change in reference range.  . Creatinine, Ser 08/27/2017 0.83  0.44 - 1.00 mg/dL Final  . Calcium 08/27/2017 8.6* 8.9 - 10.3 mg/dL Final  . Total Protein 08/27/2017 6.8  6.5 - 8.1 g/dL Final  . Albumin 08/27/2017 3.8  3.5 - 5.0 g/dL Final  . AST 08/27/2017 19  15 - 41 U/L Final  . ALT 08/27/2017 25  0 - 44 U/L Final   Please note change in reference range.  . Alkaline Phosphatase 08/27/2017 83  38 - 126 U/L Final  . Total Bilirubin 08/27/2017 0.5  0.3 - 1.2 mg/dL Final  . GFR calc non Af Amer 08/27/2017 >60  >60 mL/min Final  . GFR calc Af Amer 08/27/2017 >60  >60 mL/min Final   Comment: (NOTE) The eGFR has been calculated using the CKD EPI equation. This calculation has not been validated in all clinical situations. eGFR's persistently <60 mL/min signify possible Chronic Kidney Disease.   Georgiann Hahn gap 08/27/2017 5  5 - 15 Final   Performed at [facility], [place] [address] [place], [place] [address]  . Prothrombin Time 08/27/2017 13.2  11.4 - 15.2 seconds Final  . INR 08/27/2017 1.01   Final   Performed at [facility], [place] [address], [place], [place] [address]  . Color, Urine 08/27/2017 YELLOW  YELLOW Final  . APPearance 08/27/2017 CLEAR  CLEAR Final  . Specific Gravity, Urine 08/27/2017 1.024  1.005 - 1.030 Final  . pH 08/27/2017 5.0  5.0 - 8.0  Final  . Glucose, UA 08/27/2017 NEGATIVE  NEGATIVE mg/dL Final  . Hgb urine dipstick 08/27/2017 SMALL* NEGATIVE Final  . Bilirubin Urine 08/27/2017 NEGATIVE  NEGATIVE Final  . Ketones, ur 08/27/2017 NEGATIVE  NEGATIVE mg/dL Final  . Protein, ur 08/27/2017 NEGATIVE  NEGATIVE mg/dL Final  . Nitrite 08/27/2017 NEGATIVE  NEGATIVE Final  . Leukocytes, UA 08/27/2017 NEGATIVE  NEGATIVE Final  . RBC / HPF 08/27/2017 0-5  0 - 5 RBC/hpf Final  . WBC, UA 08/27/2017 0-5  0 - 5 WBC/hpf Final  . Bacteria, UA 08/27/2017 NONE SEEN  NONE SEEN Final  . Mucus 08/27/2017 PRESENT   Final   Performed at [facility], [place] [address] [place], [place] [address]  . MRSA, PCR 08/27/2017 POSITIVE* NEGATIVE Final   Comment: RESULT CALLED TO, READ BACK BY AND VERIFIED WITH: EWURUM,I. RN @0900  ON 6.26.19 BY NMCCOY   . Staphylococcus aureus 08/27/2017 POSITIVE* NEGATIVE Final   Comment: (NOTE) The Xpert SA Assay (FDA approved for NASAL specimens in patients 68 years of age and older), is one component of a comprehensive surveillance program. It is not intended to diagnose infection nor to guide or monitor treatment. Performed at [facility], [place] [address] [place],  [address]        Hospital Course: Debbie Floyd is a 62 y.o. who was admitted to [facility]. They were brought to the operating room on 09/09/2017 and underwent Procedure(s): LEFT TOTAL KNEE ARTHROPLASTY.  Patient tolerated the procedure well and was later transferred to the recovery  room and then to the orthopaedic floor for postoperative care.  They were given PO and IV analgesics for pain control following their surgery.  They were given 24 hours of postoperative antibiotics of  Anti-infectives (From admission, onward)   Start     Dose/Rate Route Frequency Ordered Stop   09/10/17 0200  vancomycin (VANCOCIN) IVPB 1000 mg/200 mL premix     1,000 mg 200 mL/hr over 60 Minutes Intravenous Every 12  hours 09/09/17 1722 09/10/17 0236   09/09/17 1115  ceFAZolin (ANCEF) IVPB 2g/100 mL premix  Status:  Discontinued     2 g 200 mL/hr over 30 Minutes Intravenous On call to O.R. 09/09/17 1113 09/09/17 1252   09/09/17 1112  vancomycin (VANCOCIN) IVPB 1000 mg/200 mL premix     1,000 mg 200 mL/hr over 60 Minutes Intravenous 30 min pre-op 09/09/17 1112 09/09/17 1431     and started on DVT prophylaxis in the form of Xarelto.   PT and OT were ordered for total joint protocol.  Discharge planning consulted to help with postop disposition and equipment needs.  Patient had a good night on the evening of surgery.  They started to get up OOB with therapy on day one. Hemovac drain was pulled without difficulty.  Continued to work with therapy into day two.  Dressing was changed on day two and the incision was clean and dry.  She was asymptomatic but was given a bolus of fluid due to hypotension. The patient had progressed with therapy and meeting their goals.  Incision was healing well.  Patient was seen in rounds and was ready to go home.   Diet: Cardiac diet Activity:WBAT Follow-up:in 2 weeks Disposition - Home Discharged Condition: stable   Discharge Instructions    Call MD / Call 911   Complete by:  As directed    If you experience chest pain or shortness of breath, CALL 911 and be transported to the hospital emergency room.  If you develope a fever above 101 F, pus (white drainage) or increased drainage or redness at the wound, or calf pain, call your surgeon's office.   Constipation Prevention   Complete by:  As directed    Drink plenty of fluids.  Prune juice may be helpful.  You may use a stool softener, such as Colace (over the counter) 100 mg twice a day.  Use MiraLax (over the counter) for constipation as needed.   Diet - low sodium heart healthy   Complete by:  As directed    Discharge instructions   Complete by:  As directed    Dr. Gaynelle Arabian Total Joint Specialist [facility] [id number], [place], [place] [address] [phone]  TOTAL KNEE REPLACEMENT POSTOPERATIVE DIRECTIONS  Knee Rehabilitation, Guidelines Following Surgery  Results after knee surgery are often greatly improved when you follow the exercise, range of motion and muscle strengthening exercises prescribed by your doctor. Safety measures are also important to protect the knee from further injury. Any time any of these exercises cause you to have increased pain or swelling in your knee joint, decrease the amount until you are comfortable again and slowly increase them. If you have problems or questions, call your caregiver or physical therapist for advice.   HOME CARE INSTRUCTIONS  Remove items at home which could result in a fall. This includes throw rugs or furniture in walking pathways.  ICE to the affected knee every three hours for 30 minutes at a time and then as needed  for pain and swelling.  Continue to use ice on the knee for pain and swelling from surgery. You may notice swelling that will progress down to the foot and ankle.  This is normal after surgery.  Elevate the leg when you are not up walking on it.   Continue to use the breathing machine which will help keep your temperature down.  It is common for your temperature to cycle up and down following surgery, especially at night when you are not up moving around and exerting yourself.  The breathing machine keeps your lungs expanded and your temperature down. Do not place pillow under knee, focus on keeping the knee straight while resting  DIET You may resume your previous home diet once your are discharged from the hospital.  DRESSING / WOUND CARE / SHOWERING You may change your dressing every day with sterile gauze.  Please use good hand washing techniques before changing the dressing.  Do not use any lotions or creams on the incision until instructed by your surgeon. You may start showering once you are discharged home but do not  submerge the incision under water. Just pat the incision dry and apply a dry gauze dressing on daily. Change the surgical dressing daily and reapply a dry dressing each time.  ACTIVITY Walk with your walker as instructed. Use walker as long as suggested by your caregivers. Avoid periods of inactivity such as sitting longer than an hour when not asleep. This helps prevent blood clots.  You may resume a sexual relationship in one month or when given the OK by your doctor.  You may return to work once you are cleared by your doctor.  Do not drive a car for 6 weeks or until released by you surgeon.  Do not drive while taking narcotics.  WEIGHT BEARING Weight bearing as tolerated with assist device (walker, cane, etc) as directed, use it as long as suggested by your surgeon or therapist, typically at least 4-6 weeks.  POSTOPERATIVE CONSTIPATION PROTOCOL Constipation - defined medically as fewer than three stools per week and severe constipation as less than one stool per week.  One of the most common issues patients have following surgery is constipation.  Even if you have a regular bowel pattern at home, your normal regimen is likely to be disrupted due to multiple reasons following surgery.  Combination of anesthesia, postoperative narcotics, change in appetite and fluid intake all can affect your bowels.  In order to avoid complications following surgery, here are some recommendations in order to help you during your recovery period.  Colace (docusate) - Pick up an over-the-counter form of Colace or another stool softener and take twice a day as long as you are requiring postoperative pain medications.  Take with a full glass of water daily.  If you experience loose stools or diarrhea, hold the colace until you stool forms back up.  If your symptoms do not get better within 1 week or if they get worse, check with your doctor.  Dulcolax (bisacodyl) - Pick up over-the-counter and take as directed  by the product packaging as needed to assist with the movement of your bowels.  Take with a full glass of water.  Use this product as needed if not relieved by Colace only.   MiraLax (polyethylene glycol) - Pick up over-the-counter to have on hand.  MiraLax is a solution that will increase the amount of water in your bowels to assist with bowel movements.  Take as directed and  can mix with a glass of water, juice, soda, coffee, or tea.  Take if you go more than two days without a movement. Do not use MiraLax more than once per day. Call your doctor if you are still constipated or irregular after using this medication for 7 days in a row.  If you continue to have problems with postoperative constipation, please contact the office for further assistance and recommendations.  If you experience "the worst abdominal pain ever" or develop nausea or vomiting, please contact the office immediatly for further recommendations for treatment.  ITCHING  If you experience itching with your medications, try taking only a single pain pill, or even half a pain pill at a time.  You can also use Benadryl over the counter for itching or also to help with sleep.   TED HOSE STOCKINGS Wear the elastic stockings on both legs for three weeks following surgery during the day but you may remove then at night for sleeping.  MEDICATIONS See your medication summary on the "After Visit Summary" that the nursing staff will review with you prior to discharge.  You may have some home medications which will be placed on hold until you complete the course of blood thinner medication.  It is important for you to complete the blood thinner medication as prescribed by your surgeon.  Continue your approved medications as instructed at time of discharge.  PRECAUTIONS If you experience chest pain or shortness of breath - call 911 immediately for transfer to the hospital emergency department.  If you develop a fever greater that 101 F,  purulent drainage from wound, increased redness or drainage from wound, foul odor from the wound/dressing, or calf pain - CONTACT YOUR SURGEON.                                                   FOLLOW-UP APPOINTMENTS Make sure you keep all of your appointments after your operation with your surgeon and caregivers. You should call the office at the above phone number and make an appointment for approximately two weeks after the date of your surgery or on the date instructed by your surgeon outlined in the "After Visit Summary".   RANGE OF MOTION AND STRENGTHENING EXERCISES  Rehabilitation of the knee is important following a knee injury or an operation. After just a few days of immobilization, the muscles of the thigh which control the knee become weakened and shrink (atrophy). Knee exercises are designed to build up the tone and strength of the thigh muscles and to improve knee motion. Often times heat used for twenty to thirty minutes before working out will loosen up your tissues and help with improving the range of motion but do not use heat for the first two weeks following surgery. These exercises can be done on a training (exercise) mat, on the floor, on a table or on a bed. Use what ever works the best and is most comfortable for you Knee exercises include:  Leg Lifts - While your knee is still immobilized in a splint or cast, you can do straight leg raises. Lift the leg to 60 degrees, hold for 3 sec, and slowly lower the leg. Repeat 10-20 times 2-3 times daily. Perform this exercise against resistance later as your knee gets better.  Quad and Hamstring Sets - Tighten up the muscle on  the front of the thigh (Quad) and hold for 5-10 sec. Repeat this 10-20 times hourly. Hamstring sets are done by pushing the foot backward against an object and holding for 5-10 sec. Repeat as with quad sets.  Leg Slides: Lying on your back, slowly slide your foot toward your buttocks, bending your knee up off the floor  (only go as far as is comfortable). Then slowly slide your foot back down until your leg is flat on the floor again. Angel Wings: Lying on your back spread your legs to the side as far apart as you can without causing discomfort.  A rehabilitation program following serious knee injuries can speed recovery and prevent re-injury in the future due to weakened muscles. Contact your doctor or a physical therapist for more information on knee rehabilitation.   IF YOU ARE TRANSFERRED TO A SKILLED REHAB FACILITY If the patient is transferred to a skilled rehab facility following release from the hospital, a list of the current medications will be sent to the facility for the patient to continue.  When discharged from the skilled rehab facility, please have the facility set up the patient's [place] prior to being released. Also, the skilled facility will be responsible for providing the patient with their medications at time of release from the facility to include their pain medication, the muscle relaxants, and their blood thinner medication. If the patient is still at the rehab facility at time of the two week follow up appointment, the skilled rehab facility will also need to assist the patient in arranging follow up appointment in our office and any transportation needs.  MAKE SURE YOU:  Understand these instructions.  Get help right away if you are not doing well or get worse.    Pick up stool softner and laxative for home use following surgery while on pain medications. Do not submerge incision under water. Please use good hand washing techniques while changing dressing each day. May shower starting three days after surgery. Please use a clean towel to pat the incision dry following showers. Continue to use ice for pain and swelling after surgery. Do not use any lotions or creams on the incision until instructed by your surgeon.    Information on my medicine - XARELTO  (Rivaroxaban)  Why was Xarelto prescribed for you? Xarelto was prescribed for you to reduce the risk of blood clots forming after orthopedic surgery. The medical term for these abnormal blood clots is venous thromboembolism (VTE).  What do you need to know about xarelto ? Take your Xarelto ONCE DAILY at the same time every day. You may take it either with or without food.  If you have difficulty swallowing the tablet whole, you may crush it and mix in applesauce just prior to taking your dose.  Take Xarelto exactly as prescribed by your doctor and DO NOT stop taking Xarelto without talking to the doctor who prescribed the medication.  Stopping without other VTE prevention medication to take the place of Xarelto may increase your risk of developing a clot.  After discharge, you should have regular check-up appointments with your healthcare provider that is prescribing your Xarelto.    What do you do if you miss a dose? If you miss a dose, take it as soon as you remember on the same day then continue your regularly scheduled once daily regimen the next day. Do not take two doses of Xarelto on the same day.   Important Safety Information A possible  side effect of Xarelto is bleeding. You should call your healthcare provider right away if you experience any of the following: Bleeding from an injury or your nose that does not stop. Unusual colored urine (red or dark brown) or unusual colored stools (red or black). Unusual bruising for unknown reasons. A serious fall or if you hit your head (even if there is no bleeding).  Some medicines may interact with Xarelto and might increase your risk of bleeding while on Xarelto. To help avoid this, consult your healthcare provider or pharmacist prior to using any new prescription or non-prescription medications, including herbals, vitamins, non-steroidal anti-inflammatory drugs (NSAIDs) and supplements.  This website has more information on  Xarelto: [email or web address]   Increase activity slowly as tolerated   Complete by:  As directed      Allergies as of 09/11/2017      Reactions   Adhesive [tape] Rash   IF LEFT ON FOR LONG PERIOD OF TIME   Penicillins Rash, Other (See Comments)   febrile Has patient had a PCN reaction causing immediate rash, facial/tongue/throat swelling, SOB or lightheadedness with hypotension: Yes Has patient had a PCN reaction causing severe rash involving mucus membranes or skin necrosis: No Has patient had a PCN reaction that required hospitalization: Yes Has patient had a PCN reaction occurring within the last 10 years: No If all of the above answers are "NO", then may proceed with Cephalosporin use.   Sulfa Antibiotics Other (See Comments), Rash   febrile      Medication List    STOP taking these medications   Calcium + D3 600-200 MG-UNIT Tabs   Cranberry 450 MG Caps   cyclobenzaprine 5 MG tablet Commonly known as:  FLEXERIL     TAKE these medications   atorvastatin 10 MG tablet Commonly known as:  LIPITOR Take 5 mg by mouth every evening.   doxycycline 100 MG capsule Commonly known as:  VIBRAMYCIN Take 100 mg by mouth every Monday, Wednesday, and Friday.   fluorometholone 0.1 % ophthalmic suspension Commonly known as:  FML Place 1 drop into both eyes daily as needed (dry eyes).   fluticasone 50 MCG/ACT nasal spray Commonly known as:  FLONASE Place 1 spray into both nostrils daily as needed for allergies or rhinitis.   letrozole 2.5 MG tablet Commonly known as:  FEMARA Take 1 tablet (2.5 mg total) by mouth daily.   LIALDA 1.2 g EC tablet Generic drug:  mesalamine Take 2.4 G by mouth every morning   methocarbamol 500 MG tablet Commonly known as:  ROBAXIN Take 1 tablet (500 mg total) by mouth every 6 (six) hours as needed for muscle spasms.   metoprolol succinate 50 MG 24 hr tablet Commonly known as:  TOPROL-XL Take 50 mg by mouth every morning.   omeprazole 20 MG  capsule Commonly known as:  PRILOSEC Take 20 mg by mouth 2 (two) times daily.   oxybutynin 5 MG tablet Commonly known as:  DITROPAN Take 5 mg by mouth every morning.   oxyCODONE 5 MG immediate release tablet Commonly known as:  Oxy IR/ROXICODONE Take 1-2 tablets (5-10 mg total) by mouth every 6 (six) hours as needed for moderate pain (pain score 4-6).   rivaroxaban 10 MG Tabs tablet Commonly known as:  XARELTO Take 1 tablet (10 mg total) by mouth daily.   traMADol 50 MG tablet Commonly known as:  ULTRAM Take 1-2 tablets (50-100 mg total) by mouth every 6 (six) hours as needed for moderate pain (if oxyodone  5-10 mg alone insufficient for moderate pain).      Follow-up Information    Gaynelle Arabian, MD. Schedule an appointment as soon as possible for a visit on 09/24/2017.   Specialty:  Orthopedic Surgery Contact information: [address] [place] [address] [phone]           Signed: Ardeen Jourdain, PA-C Orthopaedic Surgery 09/12/2017, 7:51 AM

## 2017-09-19 IMAGING — US US ABDOMEN LIMITED
1 series · 14 of 25 positions shown · non-contrast
Comparison: 02/02/2011

CLINICAL DATA: Epigastric abdominal pain

EXAM:
US ABDOMEN LIMITED - RIGHT UPPER QUADRANT

[Series 1: us abdomen limited · 0.19mm/px · 14 of 59 slices shown]
[im 1/59]
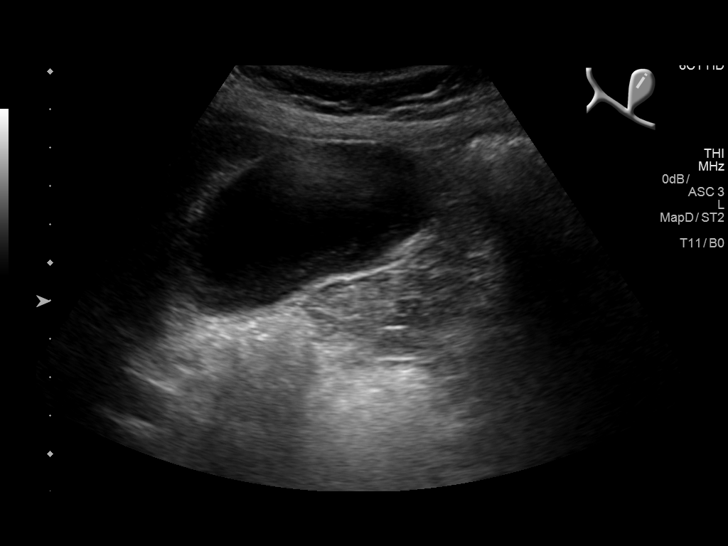
[im 5/59]
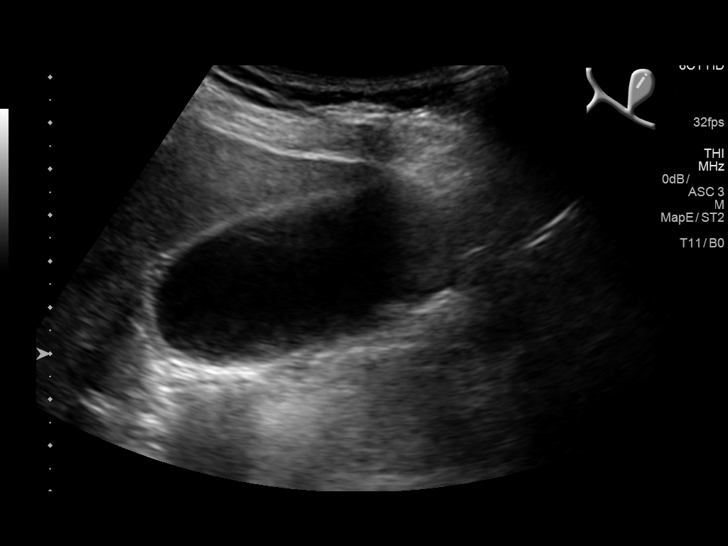
[im 10/59]
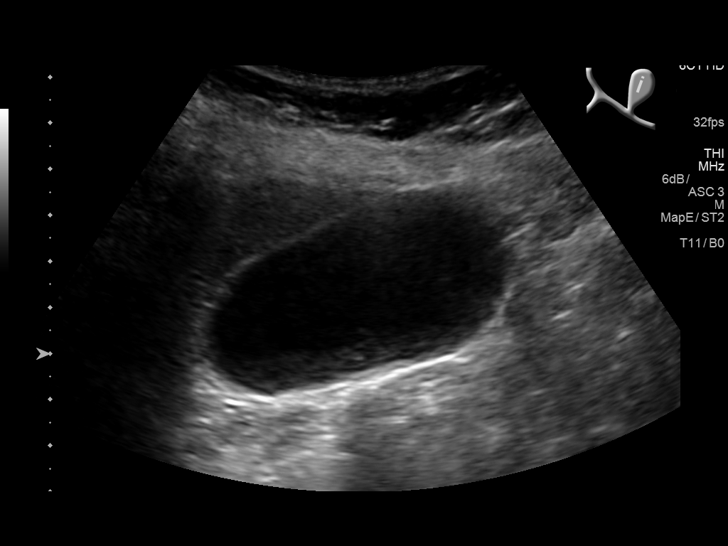
[im 15/59]
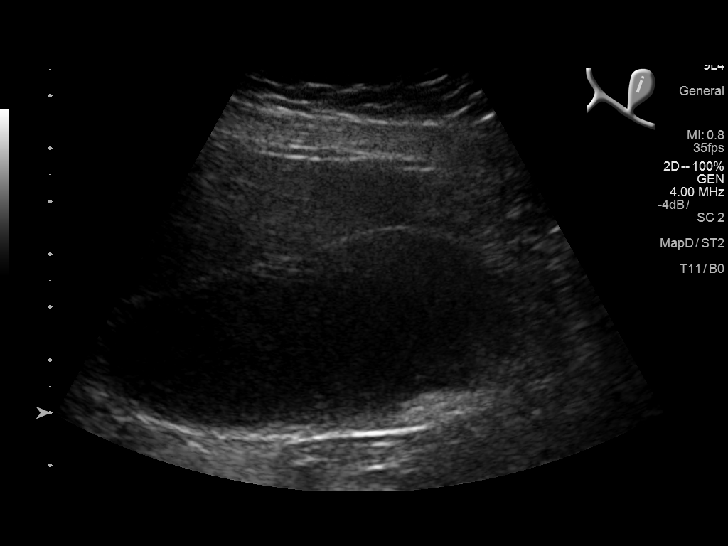
[im 20/59]
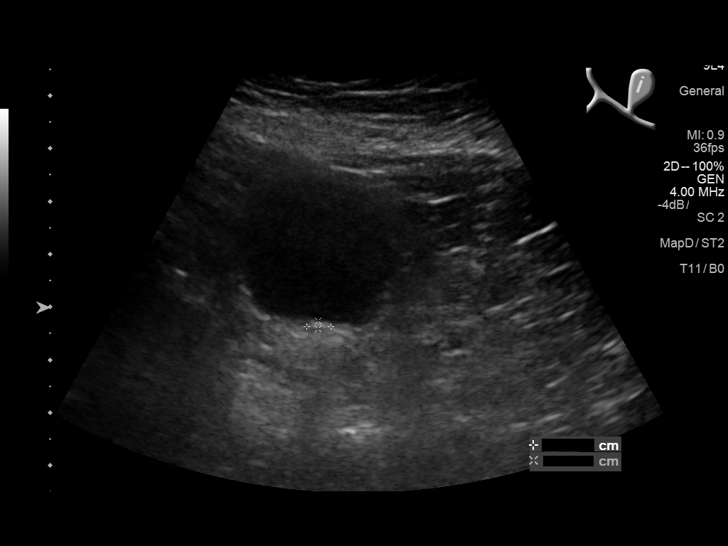
[im 22/59]
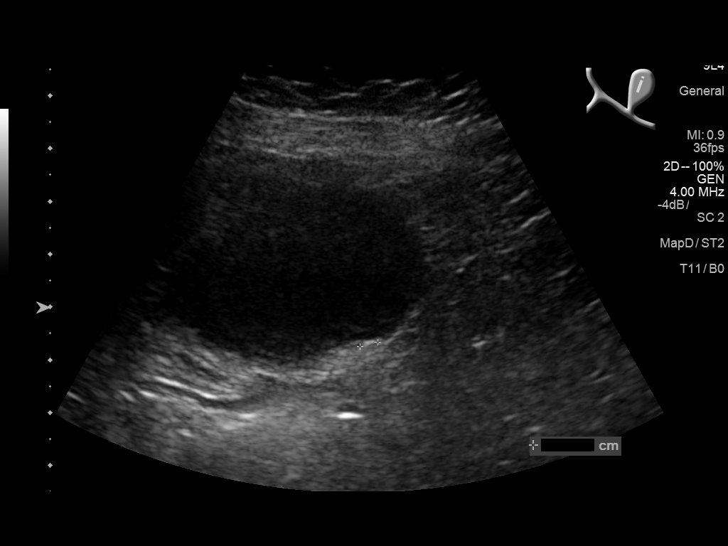
[im 27/59]
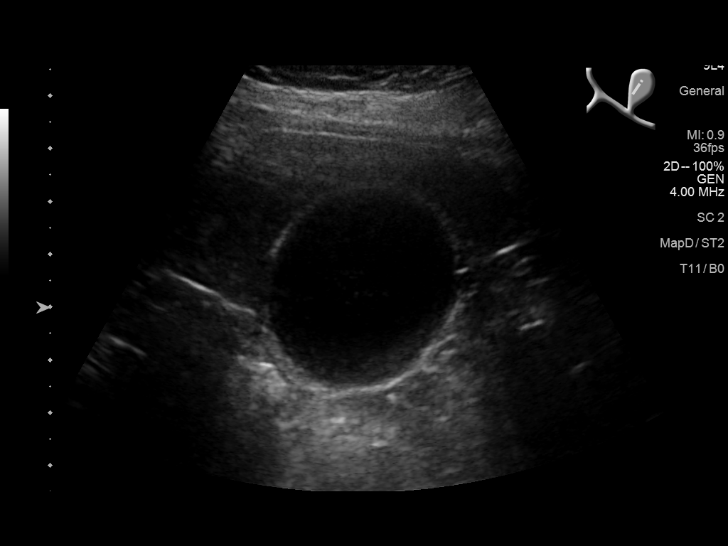
[im 32/59]
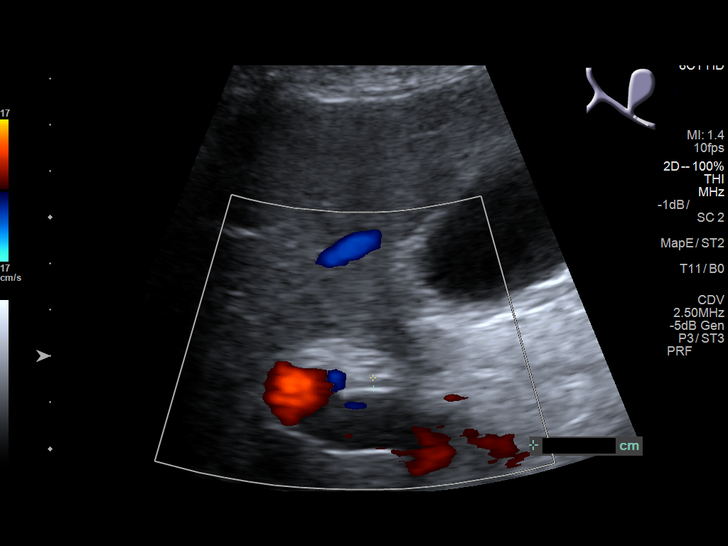
[im 37/59]
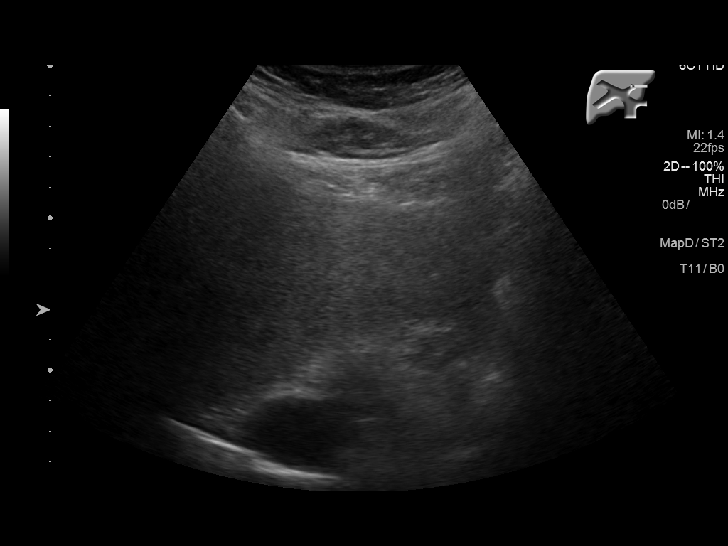
[im 39/59]
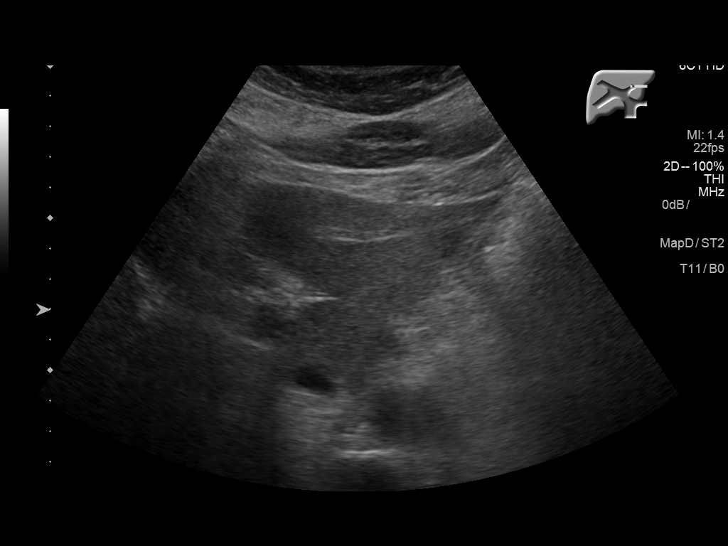
[im 44/59]
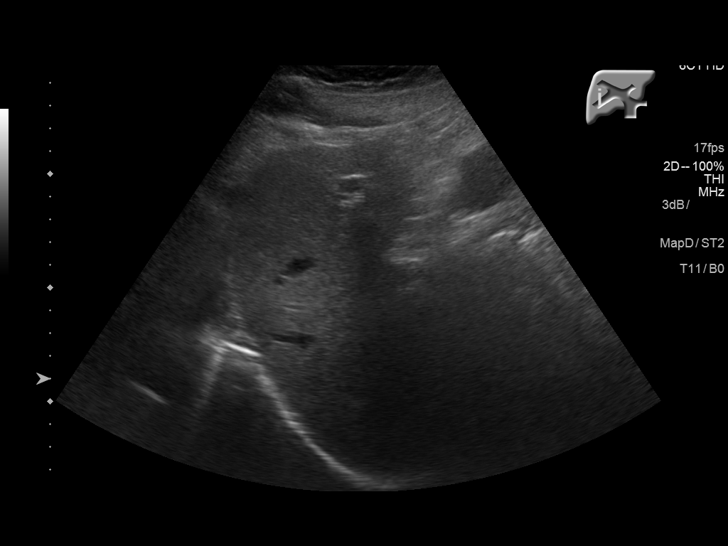
[im 49/59]
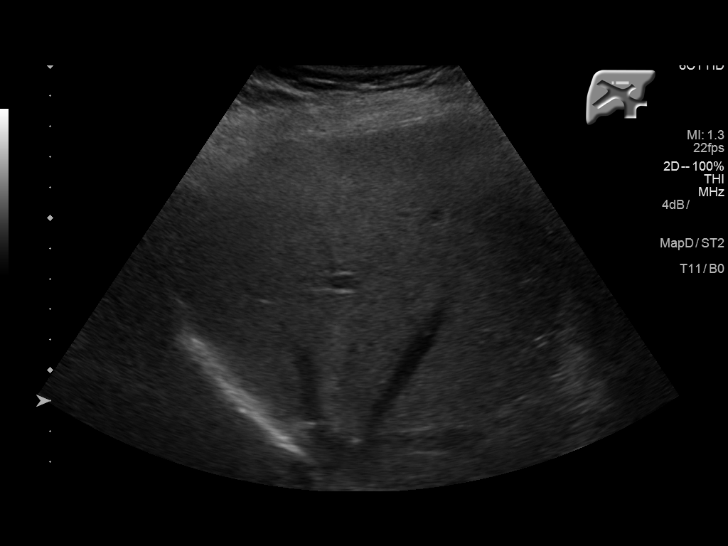
[im 54/59]
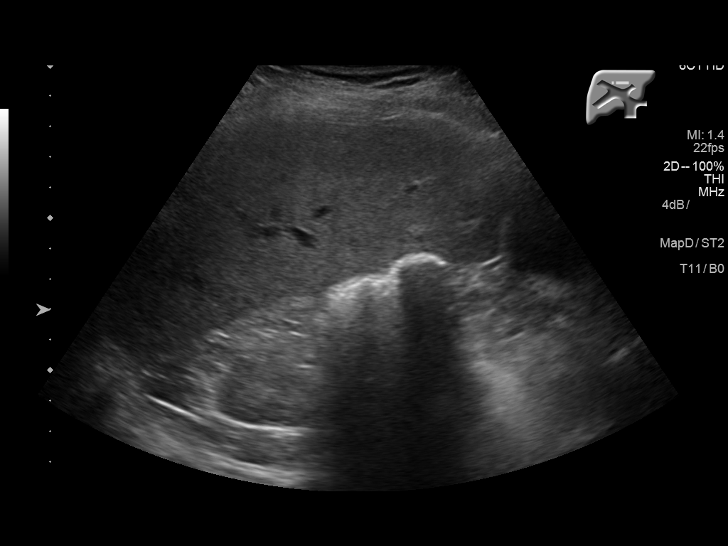
[im 59/59]
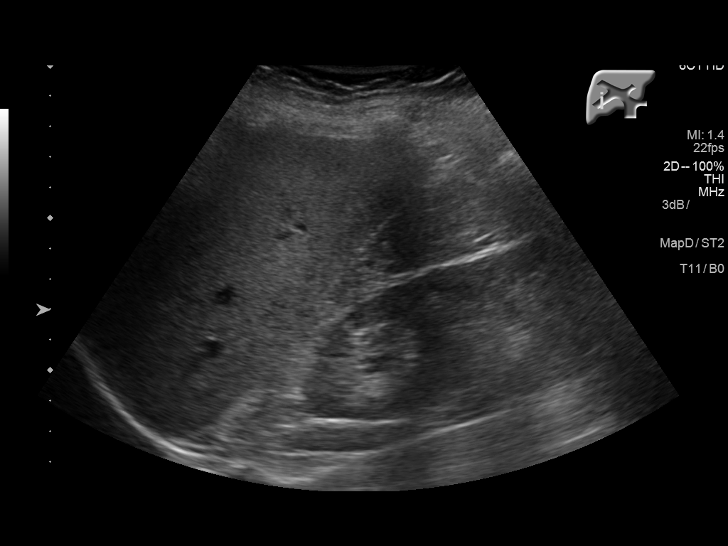

[14 of 25 positions shown; findings below may reference images not displayed]

FINDINGS: Gallbladder:

Sludge with superimposed echogenic foci potentially reflecting small
calculi. The gallbladder is dilated but there is no wall thickening
or focal tenderness.

Common bile duct:

Diameter: 3 mm.  Where visualized, no filling defect.

Liver:

No focal lesion identified. Within normal limits in parenchymal
echogenicity. Antegrade flow in the imaged hepatic and portal venous
system.
IMPRESSION: Gallbladder sludge and probable tiny admixed calculi. Gallbladder
distention but no inflammatory changes for acute cholecystitis.

## 2017-10-07 ENCOUNTER — Ambulatory Visit: Payer: Federal, State, Local not specified - PPO | Admitting: Obstetrics and Gynecology

## 2017-10-15 DIAGNOSIS — Z96652 Presence of left artificial knee joint: Secondary | ICD-10-CM | POA: Insufficient documentation

## 2017-10-20 ENCOUNTER — Other Ambulatory Visit: Payer: Self-pay | Admitting: Oncology

## 2017-10-21 ENCOUNTER — Ambulatory Visit (INDEPENDENT_AMBULATORY_CARE_PROVIDER_SITE_OTHER): Payer: Federal, State, Local not specified - PPO | Admitting: Obstetrics and Gynecology

## 2017-10-21 ENCOUNTER — Encounter: Payer: Self-pay | Admitting: Obstetrics and Gynecology

## 2017-10-21 VITALS — BP 130/80 | HR 92 | Ht 65.0 in | Wt 154.0 lb

## 2017-10-21 DIAGNOSIS — R350 Frequency of micturition: Secondary | ICD-10-CM | POA: Diagnosis not present

## 2017-10-21 DIAGNOSIS — Z1339 Encounter for screening examination for other mental health and behavioral disorders: Secondary | ICD-10-CM | POA: Diagnosis not present

## 2017-10-21 DIAGNOSIS — Z01419 Encounter for gynecological examination (general) (routine) without abnormal findings: Secondary | ICD-10-CM

## 2017-10-21 DIAGNOSIS — Z1331 Encounter for screening for depression: Secondary | ICD-10-CM | POA: Diagnosis not present

## 2017-10-21 LAB — POCT URINALYSIS DIP (MANUAL ENTRY)
Bilirubin, UA: NEGATIVE
Glucose, UA: NEGATIVE mg/dL
Ketones, POC UA: NEGATIVE mg/dL
Leukocytes, UA: NEGATIVE
NITRITE UA: NEGATIVE
UROBILINOGEN UA: 0.2 U/dL
pH, UA: 5 (ref 5.0–8.0)

## 2017-10-21 NOTE — Progress Notes (Signed)
Routine Annual Gynecology Examination   PCP: Derinda Late, MD  Chief Complaint  Patient presents with  . Annual Exam    pt did mention notice more urinary frequenc denies dysuria, Last pap 07/18   History of Present Illness: Patient is a 62 y.o. [id number] presents for annual exam. The patient has no complaints today.   Menopausal bleeding: denies  Menopausal symptoms: denies  Breast symptoms: denies, recent mammogram and clinical breast exam, both normal. No symptoms.  Last pap smear: 1 years ago.  Result Normal  Last mammogram: 6 months ago.  Result Normal, s/p breast cancer 5 years ago.   She has noticed some recent urinary frequency, especially at bedtime.  She denies dysuria and hematuria. She has had no fevers or chills or suprapubic pain. She has been taking some cranberry pills.   Past Medical History:  Diagnosis Date  . Arthritis    RIGHT KNEE  . Breast cancer ([place]) 2014   lumpectomy lt  . Cancer of left breast ([place]) 09/14/2014  . Dysrhythmia    PALPITATIONS-WELL CONTROLLED ON METOPROLOL (07-19-15)  . GERD (gastroesophageal reflux disease)   . Hypercholesterolemia   . Overactive bladder     Past Surgical History:  Procedure Laterality Date  . 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 BREAST LUMPECTOMY Left 2014   with radiation  . CHOLECYSTECTOMY N/A 07/26/2015   Procedure: LAPAROSCOPIC CHOLECYSTECTOMY WITH INTRAOPERATIVE CHOLANGIOGRAM;  Surgeon: Leonie Green, MD;  Location: [facility] ORS;  Service: General;  Laterality: N/A;  . OOPHORECTOMY Bilateral 2014  . TOTAL KNEE ARTHROPLASTY Left 09/09/2017   Procedure: LEFT TOTAL KNEE ARTHROPLASTY;  Surgeon: Gaynelle Arabian, MD;  Location: [facility];  Service: Orthopedics;  Laterality: Left;  Adductor Block    Medications   Medication Sig Start Date End Date Taking? Authorizing Provider  atorvastatin (LIPITOR) 10 MG tablet Take 5 mg by mouth every evening.   Yes [provider]  Calcium  Carb-Cholecalciferol (CALCIUM + D3) 600-200 MG-UNIT TABS TAKE 1 TABLET BY MOUTH TWICE A DAY 10/20/17  Yes Lloyd Huger, MD  doxycycline (VIBRAMYCIN) 100 MG capsule Take 100 mg by mouth every Monday, Wednesday, and Friday.    Yes [provider]  fluorometholone (FML) 0.1 % ophthalmic suspension Place 1 drop into both eyes daily as needed (dry eyes).   Yes [provider]  fluticasone (FLONASE) 50 MCG/ACT nasal spray Place 1 spray into both nostrils daily as needed for allergies or rhinitis.   Yes [provider]  mesalamine (LIALDA) 1.2 G EC tablet Take 2.4 G by mouth every morning 09/07/13  Yes [provider]  metoprolol succinate (TOPROL-XL) 50 MG 24 hr tablet Take 50 mg by mouth every morning.    Yes [provider]  omeprazole (PRILOSEC) 20 MG capsule Take 20 mg by mouth 2 (two) times daily.  02/16/14  Yes [provider]  oxybutynin (DITROPAN) 5 MG tablet Take 5 mg by mouth every morning.  01/15/14  Yes [provider]  methocarbamol (ROBAXIN) 500 MG tablet Take 1 tablet (500 mg total) by mouth every 6 (six) hours as needed for muscle spasms. Patient not taking: Reported on 10/21/2017 09/11/17   Ardeen Jourdain, PA-C  oxyCODONE (OXY IR/ROXICODONE) 5 MG immediate release tablet Take 1-2 tablets (5-10 mg total) by mouth every 6 (six) hours as needed for moderate pain (pain score 4-6). Patient not taking: Reported on 10/21/2017 09/11/17   Ardeen Jourdain, PA-C  rivaroxaban (XARELTO) 10 MG TABS tablet  Take 1 tablet (10 mg total) by mouth daily. Patient not taking: Reported on 10/21/2017 09/11/17   Ardeen Jourdain, PA-C    Allergies  Allergen Reactions  . Adhesive [Tape] Rash    IF LEFT ON FOR LONG PERIOD OF TIME  . Penicillins Rash and Other (See Comments)    febrile Has patient had a PCN reaction causing immediate rash, facial/tongue/throat swelling, SOB or lightheadedness with hypotension: Yes Has patient had a PCN reaction  causing severe rash involving mucus membranes or skin necrosis: No Has patient had a PCN reaction that required hospitalization: Yes Has patient had a PCN reaction occurring within the last 10 years: No If all of the above answers are "NO", then may proceed with Cephalosporin use.   . Sulfa Antibiotics Other (See Comments) and Rash    febrile     Obstetric History: [id number]  Social History   Socioeconomic History  . Marital status: Married    Spouse name: Not on file  . Number of children: Not on file  . Years of education: Not on file  . Highest education level: Not on file  Occupational History  . Not on file  Social Needs  . Financial resource strain: Not on file  . Food insecurity:    Worry: Not on file    Inability: Not on file  . Transportation needs:    Medical: Not on file    Non-medical: Not on file  Tobacco Use  . Smoking status: Never Smoker  . Smokeless tobacco: Never Used  Substance and Sexual Activity  . Alcohol use: Never    Frequency: Never    Comment: seldom   . Drug use: No  . Sexual activity: Yes    Birth control/protection: Post-menopausal  Lifestyle  . Physical activity:    Days per week: Not on file    Minutes per session: Not on file  . Stress: Not on file  Relationships  . Social connections:    Talks on phone: Not on file    Gets together: Not on file    Attends religious service: Not on file    Active member of club or organization: Not on file    Attends meetings of clubs or organizations: Not on file    Relationship status: Not on file  . Intimate partner violence:    Fear of current or ex partner: Not on file    Emotionally abused: Not on file    Physically abused: Not on file    Forced sexual activity: Not on file  Other Topics Concern  . Not on file  Social History Narrative  . Not on file    Family History  Problem Relation Age of Onset  . Breast cancer Paternal Aunt   . Ovarian cancer Maternal Grandmother 80  . Breast  cancer Cousin   . Breast cancer Cousin     Review of Systems  Constitutional: Negative.   HENT: Negative.   Eyes: Negative.   Respiratory: Negative.   Cardiovascular: Negative.   Gastrointestinal: Negative.   Genitourinary: Positive for frequency. Negative for dysuria, flank pain, hematuria and urgency.  Musculoskeletal: Negative.   Skin: Negative.   Neurological: Negative.   Psychiatric/Behavioral: Negative.      Physical Exam Vitals: BP 130/80 (BP Location: Right Arm, Patient Position: Sitting, Cuff Size: Normal)   Pulse 92   Ht 5' 5"  (1.651 m)   Wt 154 lb (69.9 kg)   SpO2 98%   BMI 25.63 kg/m   Physical Exam  Constitutional: She is oriented to person, place, and time. She appears well-developed and well-nourished. No distress.  Genitourinary: Uterus normal. Pelvic exam was performed with patient supine. There is no rash, tenderness, lesion or injury on the right labia. There is no rash, tenderness, lesion or injury on the left labia. No erythema, tenderness or bleeding in the vagina. No signs of injury around the vagina. No vaginal discharge found. Right adnexum does not display mass, does not display tenderness and does not display fullness. Left adnexum does not display mass, does not display tenderness and does not display fullness. Cervix does not exhibit motion tenderness, lesion, discharge or polyp.   Uterus is mobile and anteverted. Uterus is not enlarged, tender or exhibiting a mass.  HENT:  Head: Normocephalic and atraumatic.  Eyes: EOM are normal. No scleral icterus.  Neck: Normal range of motion. Neck supple. No thyromegaly present.  Cardiovascular: Normal rate and regular rhythm. Exam reveals no gallop and no friction rub.  No murmur heard. Pulmonary/Chest: Effort normal and breath sounds normal. No respiratory distress. She has no wheezes. She has no rales.  Abdominal: Soft. Bowel sounds are normal. She exhibits no distension and no mass. There is no tenderness.  There is no rebound and no guarding.  Musculoskeletal: Normal range of motion. She exhibits no edema or tenderness.  Lymphadenopathy:    She has no cervical adenopathy.       Right: No inguinal adenopathy present.       Left: No inguinal adenopathy present.  Neurological: She is alert and oriented to person, place, and time. No cranial nerve deficit.  Skin: Skin is warm and dry. No rash noted. No erythema.  Psychiatric: She has a normal mood and affect. Her behavior is normal. Judgment normal.    Female chaperone present for pelvic portions of the physical exam  Results: AUDIT Questionnaire (screen for alcoholism): 1 PHQ-9: 2   Assessment and Plan:  62 y.o. [id number] female here for routine annual gynecologic examination  Plan: Problem List Items Addressed This Visit    None    Visit Diagnoses    Women's annual routine gynecological examination    -  Primary   Urine frequency       Relevant Orders   POCT urinalysis dipstick (Completed)   Urine Culture   Screening for depression       Screening for alcoholism          Screening: -- Blood pressure screen managed by PCP -- Colonoscopy - managed by PCP -- Mammogram - not due -- Weight screening: normal -- Depression screening negative (PHQ-9) -- Nutrition: normal -- cholesterol screening: per PCP -- osteoporosis screening: not due -- tobacco screening: not using -- alcohol screening: AUDIT questionnaire indicates low-risk usage. -- family history of breast cancer screening: done. not at high risk. -- no evidence of domestic violence or intimate partner violence. -- STD screening: gonorrhea/chlamydia NAAT not collected per patient request. -- pap smear not collected per ASCCP guidelines -- HPV vaccination series: not eligilbe   Urinary Frequency: send urine culture. Precautions for worsening infection, such as back pain, fever, worsening symptoms. Will follow up with urine culture.    Prentice Docker, MD 10/21/2017  1:25 PM

## 2017-10-23 LAB — URINE CULTURE

## 2017-10-23 NOTE — Progress Notes (Signed)
Would you mind calling patient to let her know that her urine culture returned as negative? Thanks!

## 2017-10-25 ENCOUNTER — Telehealth: Payer: Self-pay | Admitting: Obstetrics and Gynecology

## 2017-10-25 NOTE — Telephone Encounter (Signed)
Left generic VM

## 2017-10-25 NOTE — Telephone Encounter (Signed)
Patient called back. Discussed negative urine culture. Discussion potentially complex etiologies of nocturia.  Recommend that she discuss this also with her PCP on 9/3 at her annual physical. Discussed the possible use of a low-dose anti-spasmodic for her bladder. She will consider this for now.

## 2017-12-27 ENCOUNTER — Other Ambulatory Visit: Payer: Self-pay | Admitting: *Deleted

## 2017-12-27 MED ORDER — CALCIUM + D3 600-200 MG-UNIT PO TABS: 1.0000 | ORAL_TABLET | Freq: Two times a day (BID) | ORAL | 5 refills | Status: DC

## 2018-04-03 ENCOUNTER — Other Ambulatory Visit: Payer: Self-pay | Admitting: Oncology

## 2018-04-28 ENCOUNTER — Other Ambulatory Visit: Payer: Federal, State, Local not specified - PPO

## 2018-04-29 ENCOUNTER — Other Ambulatory Visit: Payer: Federal, State, Local not specified - PPO

## 2018-04-29 ENCOUNTER — Other Ambulatory Visit: Payer: Self-pay | Admitting: *Deleted

## 2018-04-29 DIAGNOSIS — Z170 Estrogen receptor positive status [ER+]: Principal | ICD-10-CM

## 2018-04-29 DIAGNOSIS — C50412 Malignant neoplasm of upper-outer quadrant of left female breast: Secondary | ICD-10-CM

## 2018-04-29 NOTE — Progress Notes (Signed)
im

## 2018-05-08 DIAGNOSIS — I10 Essential (primary) hypertension: Secondary | ICD-10-CM | POA: Insufficient documentation

## 2018-05-11 NOTE — Progress Notes (Deleted)
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  INTERVAL HISTORY: Patient returns to clinic today for routine six-month follow-up. She has some mild left neck pain, but otherwise feels well and is asymptomatic. She continues to tolerate letrozole well without significant side effects. She has no neurologic complaints. She denies any recent fevers or illnesses. She has a good appetite and denies weight loss. She has no chest pain or shortness of breath. She denies any nausea, vomiting, constipation, or diarrhea. She has no urinary complaints. Patient feels at her baseline and offers no further specific complaints today.   REVIEW OF SYSTEMS:   Review of Systems  Constitutional: Negative.  Negative for fever, malaise/fatigue and weight loss.  Respiratory: Negative.  Negative for cough and shortness of breath.   Cardiovascular: Negative.  Negative for chest pain and leg swelling.  Gastrointestinal: Negative.  Negative for abdominal pain.  Genitourinary: Negative.   Musculoskeletal: Positive for neck pain.  Skin: Negative.  Negative for rash.  Neurological: Negative.  Negative for sensory change and weakness.  Psychiatric/Behavioral: Negative.  The patient is not nervous/anxious.     As per HPI. Otherwise, a complete review of systems is negative.  ONCOLOGY HISTORY: Oncology History   63 year old female with pathologic stage Ib (T1 B. N0 M0) invasive mammary carcinoma status post wide local excision and sentinel node biopsy ER/PR positive HER-2/neu negative by fish 2,Oncotype Dx  score is 6% (low) 3.  Finished radiation therapy may 2014 4,  Femara of  June of 2014 5.patient had bilateral  oophorectomy inNovember of 2014     Breast cancer of upper-outer quadrant of left female breast [facility])    PAST MEDICAL HISTORY: Past Medical History:  Diagnosis Date  . Arthritis    RIGHT KNEE  . Breast cancer ([place]) 2014   lumpectomy lt  . Cancer of left breast ([place]) 09/14/2014  . Dysrhythmia    PALPITATIONS-WELL CONTROLLED ON METOPROLOL (07-19-15)  . GERD (gastroesophageal reflux disease)   . Hypercholesterolemia   . Overactive bladder     PAST SURGICAL HISTORY: Past Surgical History:  Procedure Laterality Date  . 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 BREAST LUMPECTOMY Left 2014   with radiation  . CHOLECYSTECTOMY N/A 07/26/2015   Procedure: LAPAROSCOPIC CHOLECYSTECTOMY WITH INTRAOPERATIVE CHOLANGIOGRAM;  Surgeon: Leonie Green, MD;  Location: [facility] ORS;  Service: General;  Laterality: N/A;  . OOPHORECTOMY Bilateral 2014  . TOTAL KNEE ARTHROPLASTY Left 09/09/2017   Procedure: LEFT TOTAL KNEE ARTHROPLASTY;  Surgeon: Gaynelle Arabian, MD;  Location: [facility];  Service: Orthopedics;  Laterality: Left;  Adductor Block    FAMILY HISTORY Family History  Problem Relation Age of Onset  . Breast cancer Paternal Aunt   . Ovarian cancer Maternal Grandmother 80  . Breast cancer Cousin   . Breast cancer Cousin     GYNECOLOGIC HISTORY:  No LMP recorded. Patient is postmenopausal.     ADVANCED DIRECTIVES:    HEALTH MAINTENANCE: Social History   Tobacco Use  . Smoking status: Never Smoker  . Smokeless tobacco: Never Used  Substance Use Topics  . Alcohol use: Never    Frequency: Never    Comment: seldom   . Drug use: No  Colonoscopy:  PAP:  Bone density:  Mammogram: 04/2015  Allergies  Allergen Reactions  . Adhesive [Tape] Rash    IF LEFT ON FOR LONG PERIOD OF TIME  . Penicillins Rash and Other (See Comments)    febrile Has patient had a PCN reaction causing immediate rash, facial/tongue/throat swelling, SOB or lightheadedness with hypotension:  Yes Has patient had a PCN reaction causing severe rash involving mucus membranes or skin necrosis: No Has patient had a PCN reaction that required hospitalization: Yes Has patient had a PCN reaction occurring within the last 10 years: No If all of the above answers are "NO", then may proceed with Cephalosporin use.   . Sulfa Antibiotics Other (See Comments) and Rash    febrile    Current Outpatient Medications  Medication Sig Dispense Refill  . atorvastatin (LIPITOR) 10 MG tablet Take 5 mg by mouth every evening.    . Calcium Carb-Cholecalciferol (CALCIUM + D3) 600-200 MG-UNIT TABS TAKE 1 TABLET BY MOUTH TWICE A DAY 150 tablet 5  . doxycycline (VIBRAMYCIN) 100 MG capsule Take 100 mg by mouth every Monday, Wednesday, and Friday.     . fluorometholone (FML) 0.1 % ophthalmic suspension Place 1 drop into both eyes daily as needed (dry eyes).    . fluticasone (FLONASE) 50 MCG/ACT nasal spray Place 1 spray into both nostrils daily as needed for allergies or rhinitis.    Marland Kitchen letrozole (FEMARA) 2.5 MG tablet Take 1 tablet (2.5 mg total) by mouth daily. (Patient not taking: Reported on 08/21/2017) 90 tablet 3  . mesalamine (LIALDA) 1.2 G EC tablet Take 2.4 G by mouth every morning    . methocarbamol (ROBAXIN) 500 MG tablet Take 1 tablet (500 mg total) by mouth every 6 (six) hours as needed for muscle spasms. (Patient not taking: Reported on 10/21/2017) 40 tablet 0  . metoprolol succinate (TOPROL-XL) 50 MG 24 hr tablet Take 50 mg by mouth every morning.     Marland Kitchen omeprazole (PRILOSEC) 20 MG capsule Take 20 mg by mouth 2 (two) times daily.     Marland Kitchen oxybutynin (DITROPAN) 5 MG tablet Take 5 mg by mouth every morning.     Marland Kitchen oxyCODONE (OXY IR/ROXICODONE) 5 MG immediate release tablet Take 1-2 tablets (5-10 mg total) by mouth every 6 (six) hours as needed for moderate pain (pain score 4-6). (Patient not taking: Reported on 10/21/2017) 56 tablet 0  . rivaroxaban (XARELTO) 10 MG TABS tablet Take 1 tablet (10 mg total) by  mouth daily. (Patient not taking: Reported on 10/21/2017) 20 tablet 0   No current facility-administered medications for this visit.     OBJECTIVE: There were no vitals taken for this visit.   There is no height or weight on file to calculate BMI.    ECOG FS:0 - Asymptomatic  General: Well-developed, well-nourished, no acute distress. Eyes: Pink conjunctiva, anicteric sclera.   Neck: No palpable masses or adenopathy. Breasts: Bilateral breast and axilla without lumps or masses. Patient requested exam be deferred today. Lungs: Clear to auscultation bilaterally. Heart: Regular rate and rhythm. No rubs, murmurs, or gallops. Abdomen: Soft, nontender, nondistended. No organomegaly noted, normoactive bowel sounds. Musculoskeletal: No edema, cyanosis, or clubbing. Neuro: Alert, answering all questions appropriately. Cranial nerves grossly intact. Skin: No rashes or petechiae noted. Psych: Normal affect.  LAB RESULTS:    STUDIES: No results found.  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  PLAN:    1. Pathologic stage Ia  adenocarcinoma ER/PR positive adenocarcinoma of the upper outer quadrant of the left breast: No evidence of disease. Continue letrozole for 5 years completing in June 2019. Patient's most recent mammogram on April 12, 2016 was reported as BI-RADS 2, repeat in February 2019. Return to clinic in 1 year for routine evaluation. 2. Postmenopausal: Patient's most recent bone mineral density completed on April 12, 2016 reported a T score of -1.0 which is only slightly decreased from 2 years prior when it was -0.7.  This is still considered within normal limits. Repeat in February 2020. Continue calcium and vitamin D supplementation.   3.  Neck pain: Appears musculoskeletal in nature.  Monitor.  Approximately 20 minutes was spent in discussion of which greater than 50% was  consultation.  Patient expressed understanding and was in agreement with this plan. She also understands that She can call clinic at any time with any questions, concerns, or complaints.    Lloyd Huger, MD   05/11/2018 5:39 PM

## 2018-05-14 ENCOUNTER — Ambulatory Visit
Admission: RE | Admit: 2018-05-14 | Discharge: 2018-05-14 | Disposition: A | Discharge status: Home / Self Care | Payer: Federal, State, Local not specified - PPO | Source: Ambulatory Visit | Attending: Oncology | Admitting: Oncology

## 2018-05-14 ENCOUNTER — Other Ambulatory Visit: Payer: Self-pay

## 2018-05-14 DIAGNOSIS — C50412 Malignant neoplasm of upper-outer quadrant of left female breast: Secondary | ICD-10-CM | POA: Insufficient documentation

## 2018-05-14 DIAGNOSIS — Z170 Estrogen receptor positive status [ER+]: Secondary | ICD-10-CM | POA: Insufficient documentation

## 2018-05-14 HISTORY — DX: Personal history of irradiation: Z92.3

## 2018-05-15 ENCOUNTER — Inpatient Hospital Stay: Payer: Federal, State, Local not specified - PPO | Admitting: Oncology

## 2018-05-19 ENCOUNTER — Telehealth: Payer: Self-pay | Admitting: *Deleted

## 2018-05-19 NOTE — Telephone Encounter (Signed)
Pt notified of mammogram results. Pt to keep f/u as scheduled.

## 2018-05-19 NOTE — Progress Notes (Deleted)
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  INTERVAL HISTORY: Patient returns to clinic today for routine six-month follow-up. She has some mild left neck pain, but otherwise feels well and is asymptomatic. She continues to tolerate letrozole well without significant side effects. She has no neurologic complaints. She denies any recent fevers or illnesses. She has a good appetite and denies weight loss. She has no chest pain or shortness of breath. She denies any nausea, vomiting, constipation, or diarrhea. She has no urinary complaints. Patient feels at her baseline and offers no further specific complaints today.   REVIEW OF SYSTEMS:   Review of Systems  Constitutional: Negative.  Negative for fever, malaise/fatigue and weight loss.  Respiratory: Negative.  Negative for cough and shortness of breath.   Cardiovascular: Negative.  Negative for chest pain and leg swelling.  Gastrointestinal: Negative.  Negative for abdominal pain.  Genitourinary: Negative.   Musculoskeletal: Positive for neck pain.  Skin: Negative.  Negative for rash.  Neurological: Negative.  Negative for sensory change and weakness.  Psychiatric/Behavioral: Negative.  The patient is not nervous/anxious.     As per HPI. Otherwise, a complete review of systems is negative.  ONCOLOGY HISTORY: Oncology History   63 year old female with pathologic stage Ib (T1 B. N0 M0) invasive mammary carcinoma status post wide local excision and sentinel node biopsy ER/PR positive HER-2/neu negative by fish 2,Oncotype Dx  score is 6% (low) 3.  Finished radiation therapy may 2014 4,  Femara of  June of 2014 5.patient had bilateral  oophorectomy inNovember of 2014     Breast cancer of upper-outer quadrant of left female breast [facility])    PAST MEDICAL HISTORY: Past Medical History:  Diagnosis Date  . Arthritis    RIGHT KNEE  . Breast cancer ([place]) 2014   lumpectomy lt  . Cancer of left breast ([place]) 09/14/2014  . Dysrhythmia    PALPITATIONS-WELL CONTROLLED ON METOPROLOL (07-19-15)  . GERD (gastroesophageal reflux disease)   . Hypercholesterolemia   . Overactive bladder   . Personal history of radiation therapy     PAST SURGICAL HISTORY: Past Surgical History:  Procedure Laterality Date  . 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 BREAST LUMPECTOMY Left 2014   with radiation  . CHOLECYSTECTOMY N/A 07/26/2015   Procedure: LAPAROSCOPIC CHOLECYSTECTOMY WITH INTRAOPERATIVE CHOLANGIOGRAM;  Surgeon: Leonie Green, MD;  Location: [facility] ORS;  Service: General;  Laterality: N/A;  . OOPHORECTOMY Bilateral 2014  . TOTAL KNEE ARTHROPLASTY Left 09/09/2017   Procedure: LEFT TOTAL KNEE ARTHROPLASTY;  Surgeon: Gaynelle Arabian, MD;  Location: [facility] ORS;  Service: Orthopedics;  Laterality: Left;  Adductor Block    FAMILY HISTORY Family History  Problem Relation Age of Onset  . Breast cancer Paternal Aunt   . Ovarian cancer Maternal Grandmother 80  . Breast cancer Cousin   . Breast cancer Cousin     GYNECOLOGIC HISTORY:  No LMP recorded. Patient is postmenopausal.     ADVANCED DIRECTIVES:    HEALTH MAINTENANCE: Social History   Tobacco Use  . Smoking status: Never Smoker  . Smokeless tobacco: Never Used  Substance Use Topics  . Alcohol use: Never    Frequency: Never  Comment: seldom   . Drug use: No     Colonoscopy:  PAP:  Bone density:  Mammogram: 04/2015  Allergies  Allergen Reactions  . Adhesive [Tape] Rash    IF LEFT ON FOR LONG PERIOD OF TIME  . Penicillins Rash and Other (See Comments)    febrile Has patient had a PCN reaction causing immediate rash, facial/tongue/throat  swelling, SOB or lightheadedness with hypotension: Yes Has patient had a PCN reaction causing severe rash involving mucus membranes or skin necrosis: No Has patient had a PCN reaction that required hospitalization: Yes Has patient had a PCN reaction occurring within the last 10 years: No If all of the above answers are "NO", then may proceed with Cephalosporin use.   . Sulfa Antibiotics Other (See Comments) and Rash    febrile    Current Outpatient Medications  Medication Sig Dispense Refill  . atorvastatin (LIPITOR) 10 MG tablet Take 5 mg by mouth every evening.    . Calcium Carb-Cholecalciferol (CALCIUM + D3) 600-200 MG-UNIT TABS TAKE 1 TABLET BY MOUTH TWICE A DAY 150 tablet 5  . doxycycline (VIBRAMYCIN) 100 MG capsule Take 100 mg by mouth every Monday, Wednesday, and Friday.     . fluorometholone (FML) 0.1 % ophthalmic suspension Place 1 drop into both eyes daily as needed (dry eyes).    . fluticasone (FLONASE) 50 MCG/ACT nasal spray Place 1 spray into both nostrils daily as needed for allergies or rhinitis.    Marland Kitchen letrozole (FEMARA) 2.5 MG tablet Take 1 tablet (2.5 mg total) by mouth daily. (Patient not taking: Reported on 08/21/2017) 90 tablet 3  . mesalamine (LIALDA) 1.2 G EC tablet Take 2.4 G by mouth every morning    . methocarbamol (ROBAXIN) 500 MG tablet Take 1 tablet (500 mg total) by mouth every 6 (six) hours as needed for muscle spasms. (Patient not taking: Reported on 10/21/2017) 40 tablet 0  . metoprolol succinate (TOPROL-XL) 50 MG 24 hr tablet Take 50 mg by mouth every morning.     Marland Kitchen omeprazole (PRILOSEC) 20 MG capsule Take 20 mg by mouth 2 (two) times daily.     Marland Kitchen oxybutynin (DITROPAN) 5 MG tablet Take 5 mg by mouth every morning.     Marland Kitchen oxyCODONE (OXY IR/ROXICODONE) 5 MG immediate release tablet Take 1-2 tablets (5-10 mg total) by mouth every 6 (six) hours as needed for moderate pain (pain score 4-6). (Patient not taking: Reported on 10/21/2017) 56 tablet 0  . rivaroxaban  (XARELTO) 10 MG TABS tablet Take 1 tablet (10 mg total) by mouth daily. (Patient not taking: Reported on 10/21/2017) 20 tablet 0   No current facility-administered medications for this visit.     OBJECTIVE: There were no vitals taken for this visit.   There is no height or weight on file to calculate BMI.    ECOG FS:0 - Asymptomatic  General: Well-developed, well-nourished, no acute distress. Eyes: Pink conjunctiva, anicteric sclera.   Neck: No palpable masses or adenopathy. Breasts: Bilateral breast and axilla without lumps or masses. Patient requested exam be deferred today. Lungs: Clear to auscultation bilaterally. Heart: Regular rate and rhythm. No rubs, murmurs, or gallops. Abdomen: Soft, nontender, nondistended. No organomegaly noted, normoactive bowel sounds. Musculoskeletal: No edema, cyanosis, or clubbing. Neuro: Alert, answering all questions appropriately. Cranial nerves grossly intact. Skin: No rashes or petechiae noted. Psych: Normal affect.  LAB RESULTS:    STUDIES: No results found.  ASSESSMENT: Pathologic stage Ia adenocarcinoma ER/PR positive adenocarcinoma of the upper outer quadrant of  the left breast.  PLAN:    1. 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 Continue letrozole for 5 years completing in June 2019. Patient's most recent mammogram on April 12, 2016 was reported as BI-RADS 2, repeat in February 2019. Return to clinic in 1 year for routine evaluation. 2. Postmenopausal: Patient's most recent bone mineral density completed on April 12, 2016 reported a T score of -1.0 which is only slightly decreased from 2 years prior when it was -0.7.  This is still considered within normal limits. Repeat in February 2020. Continue calcium and vitamin D supplementation.   3.  Neck pain: Appears musculoskeletal in nature.  Monitor.  Approximately 20 minutes was spent in  discussion of which greater than 50% was consultation.  Patient expressed understanding and was in agreement with this plan. She also understands that She can call clinic at any time with any questions, concerns, or complaints.    Lloyd Huger, MD   05/19/2018 7:43 AM

## 2018-05-20 ENCOUNTER — Inpatient Hospital Stay: Payer: Federal, State, Local not specified - PPO | Admitting: Oncology

## 2018-06-26 ENCOUNTER — Other Ambulatory Visit: Payer: Self-pay

## 2018-06-27 ENCOUNTER — Encounter: Payer: Self-pay | Admitting: Oncology

## 2018-06-27 ENCOUNTER — Other Ambulatory Visit: Payer: Self-pay

## 2018-06-27 ENCOUNTER — Inpatient Hospital Stay: Payer: Federal, State, Local not specified - PPO | Attending: Oncology | Admitting: Oncology

## 2018-06-27 DIAGNOSIS — Z170 Estrogen receptor positive status [ER+]: Secondary | ICD-10-CM | POA: Diagnosis not present

## 2018-06-27 DIAGNOSIS — Z79811 Long term (current) use of aromatase inhibitors: Secondary | ICD-10-CM

## 2018-06-27 DIAGNOSIS — Z923 Personal history of irradiation: Secondary | ICD-10-CM | POA: Diagnosis not present

## 2018-06-27 DIAGNOSIS — Z79899 Other long term (current) drug therapy: Secondary | ICD-10-CM

## 2018-06-27 DIAGNOSIS — Z803 Family history of malignant neoplasm of breast: Secondary | ICD-10-CM

## 2018-06-27 DIAGNOSIS — Z8041 Family history of malignant neoplasm of ovary: Secondary | ICD-10-CM

## 2018-06-27 DIAGNOSIS — C50412 Malignant neoplasm of upper-outer quadrant of left female breast: Secondary | ICD-10-CM

## 2018-06-27 NOTE — Progress Notes (Signed)
[facility]  [phone]) [phone]  Fax:(336) [place] DOB: [birth year]  MR#: [id number]  RJJ#:[id number]  Patient Care Team: Derinda Late, MD as PCP - General (Family Medicine)  I connected with Richardo Priest on 06/27/18 at 10:30 AM EDT by video enabled telemedicine visit and verified that I am speaking with the correct person using two identifiers.   I discussed the limitations, risks, security and privacy concerns of performing an evaluation and management service by telemedicine and the availability of in-person appointments. I also discussed with the patient that there may be a patient responsible charge related to this service. The patient expressed understanding and agreed to proceed.   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  INTERVAL HISTORY: Patient agreed to video enabled telemedicine visit for her routine 71-monthevaluation.  She completed 5 years of letrozole in June 2019.  She currently feels well and is asymptomatic.  She has no neurologic complaints. She denies any recent fevers or illnesses. She has a good appetite and denies weight loss. She has no chest pain or shortness of breath. She denies any nausea, vomiting, constipation, or diarrhea. She has no urinary complaints.  Patient feels at her baseline offers no specific complaints today.  REVIEW OF SYSTEMS:   Review of Systems  Constitutional: Negative.  Negative for fever, malaise/fatigue and weight loss.  Respiratory: Negative.  Negative for cough and shortness of breath.   Cardiovascular: Negative.  Negative for chest pain and leg swelling.  Gastrointestinal: Negative.  Negative for abdominal pain.  Genitourinary: Negative.   Musculoskeletal:  Negative.  Negative for neck pain.  Skin: Negative.  Negative for rash.  Neurological: Negative.  Negative for sensory change and weakness.  Psychiatric/Behavioral: Negative.  The patient is not nervous/anxious.     As per HPI. Otherwise, a complete review of systems is negative.  ONCOLOGY HISTORY: Oncology History   63year old female with pathologic stage Ib (T1 B. N0 M0) invasive mammary carcinoma status post wide local excision and sentinel node biopsy ER/PR positive HER-2/neu negative by fish 2,Oncotype Dx  score is 6% (low) 3.  Finished radiation therapy may 2014 4,  Femara of  June of 2014 5.patient had bilateral oophorectomy inNovember of 2014     Breast cancer of upper-outer quadrant of left female breast ([facility]    PAST MEDICAL HISTORY: Past Medical History:  Diagnosis Date   Arthritis    RIGHT KNEE   [place]   [place]   [place]   Dysrhythmia    PALPITATIONS-WELL CONTROLLED ON METOPROLOL (07-19-15)   GERD (gastroesophageal reflux disease)    Hypercholesterolemia    Overactive bladder    Personal history of radiation therapy     PAST SURGICAL HISTORY: Past Surgical History:  Procedure Laterality Date   BREAST BIOPSY Left 2014   Invasive ductal carcinoma   BREAST LUMPECTOMY Left 2014   with radiation   CHOLECYSTECTOMY N/A 07/26/2015   Procedure: LAPAROSCOPIC CHOLECYSTECTOMY WITH INTRAOPERATIVE CHOLANGIOGRAM;  Surgeon: JLeonie Green MD;  Location: [facility] ORS;  Service: General;  Laterality: N/A;   OOPHORECTOMY Bilateral 2014   TOTAL KNEE ARTHROPLASTY Left 09/09/2017   Procedure: LEFT TOTAL KNEE ARTHROPLASTY;  Surgeon: AGaynelle Arabian MD;  Location: [facility]S;  Service:  Orthopedics;  Laterality: Left;  Adductor Block    FAMILY HISTORY Family History  Problem Relation Age of Onset   Breast cancer Paternal Aunt    Ovarian cancer Maternal Grandmother 80   Breast cancer Cousin    Breast cancer  Cousin     GYNECOLOGIC HISTORY:  No LMP recorded. Patient is postmenopausal.     ADVANCED DIRECTIVES:    HEALTH MAINTENANCE: Social History   Tobacco Use   Smoking status: Never Smoker   Smokeless tobacco: Never Used  Substance Use Topics   Alcohol use: Never    Frequency: Never    Comment: seldom    Drug use: No     Colonoscopy:  PAP:  Bone density:  Mammogram: 04/2015  Allergies  Allergen Reactions   Adhesive [Tape] Rash    IF LEFT ON FOR LONG PERIOD OF TIME   Penicillins Rash and Other (See Comments)    febrile Has patient had a PCN reaction causing immediate rash, facial/tongue/throat swelling, SOB or lightheadedness with hypotension: Yes Has patient had a PCN reaction causing severe rash involving mucus membranes or skin necrosis: No Has patient had a PCN reaction that required hospitalization: Yes Has patient had a PCN reaction occurring within the last 10 years: No If all of the above answers are "NO", then may proceed with Cephalosporin use.    Sulfa Antibiotics Other (See Comments) and Rash    febrile    Current Outpatient Medications  Medication Sig Dispense Refill   atorvastatin (LIPITOR) 10 MG tablet Take 5 mg by mouth every evening.     Calcium Carb-Cholecalciferol (CALCIUM + D3) 600-200 MG-UNIT TABS TAKE 1 TABLET BY MOUTH TWICE A DAY 150 tablet 5   doxycycline (VIBRAMYCIN) 100 MG capsule Take 100 mg by mouth.      fluorometholone (FML) 0.1 % ophthalmic suspension Place 1 drop into both eyes daily as needed (dry eyes).     lisinopril (ZESTRIL) 10 MG tablet Take 1 tablet by mouth 1 day or 1 dose.     mesalamine (LIALDA) 1.2 G EC tablet Take 2.4 G by mouth every morning     metoprolol succinate (TOPROL-XL) 50 MG 24 hr tablet Take 50 mg by mouth every morning.      omeprazole (PRILOSEC) 20 MG capsule Take 20 mg by mouth 2 (two) times daily.      OSPHENA 60 MG TABS Take 1 tablet by mouth daily.     oxybutynin (DITROPAN) 5 MG tablet Take 5  mg by mouth every morning.      fluticasone (FLONASE) 50 MCG/ACT nasal spray Place 1 spray into both nostrils daily as needed for allergies or rhinitis.     No current facility-administered medications for this visit.     OBJECTIVE: There were no vitals taken for this visit.   There is no height or weight on file to calculate BMI.    ECOG FS:0 - Asymptomatic  General: Well-developed, well-nourished, no acute distress. HEENT: Normocephalic, moist mucous membranes. Neuro: Alert, answering all questions appropriately. Cranial nerves grossly intact. Skin: No rashes or petechiae noted. Psych: Normal affect.  LAB RESULTS:   STUDIES: No results found.  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  PLAN:    1. 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  Patient completed 5 years of letrozole in June 2019.  Her most recent mammogram on May 14, 2018 was reported BI-RADS 2.  Repeat in March 2021.  Return to clinic in 1 year for routine evaluation.   2. Postmenopausal: Patient's most recent bone mineral density completed on May 14, 2018 revealed a T score of -1.3 which is slightly decreased from 2 years prior when it was reported at -1.0.  No intervention is needed at this time.  Continue calcium and vitamin D supplementation.  Now the patient has discontinued letrozole, her bone marrow densities can be monitored by her primary care physician.    I provided 20 minutes of face-to-face video visit time during this encounter, and > 50% was spent counseling as documented under my assessment & plan.  Patient expressed understanding and was in agreement with this plan. She also understands that She can call clinic at any time with any questions, concerns, or complaints.    Lloyd Huger, MD   06/27/2018 12:16 PM

## 2018-06-27 NOTE — Progress Notes (Signed)
Patient stated that she had been doing well with no complaints. Patient stated that she is no longer taking Femara since it was removed last year in June. Patient stated that she had no bumps/knots, nipple discharge or skin discoloration. Patient does her monthly self-breast exam.

## 2018-12-09 ENCOUNTER — Encounter (HOSPITAL_COMMUNITY): Payer: Self-pay

## 2018-12-09 NOTE — Patient Instructions (Addendum)
DUE TO COVID-19 ONLY ONE VISITOR IS ALLOWED TO COME WITH YOU AND STAY IN THE WAITING ROOM ONLY DURING PRE OP AND PROCEDURE. THE ONE VISITOR MAY VISIT WITH YOU IN YOUR PRIVATE ROOM DURING VISITING HOURS ONLY!!   COVID SWAB TESTING MUST BE COMPLETED ON:  Thursday, Oct. 8, 2020 at 1:00PM, [facility] [place] [place].  (Must self quarantine after testing. Follow instructions on handout.)           Your procedure is scheduled on: Monday, Oct. 12, 2020   Report to [facility] Main  Entrance    Report to admitting at 7:05 AM   Call this number if you have problems the morning of surgery [phone]   Do not eat food:After Midnight.   May have liquids until 6:30AM day of surgery   CLEAR LIQUID DIET  Foods Allowed                                                                     Foods Excluded  Water, Black Coffee and tea, regular and decaf                             liquids that you cannot  Plain Jell-O in any flavor  (No red)                                           see through such as: Fruit ices (not with fruit pulp)                                     milk, soups, orange juice  Iced Popsicles (No red)                                    All solid food Carbonated beverages, regular and diet                                    Apple juices Sports drinks like Gatorade (No red) Lightly seasoned clear broth or consume(fat free) Sugar, honey syrup  Sample Menu Breakfast                                Lunch                                     Supper Cranberry juice                    Beef broth                            Chicken broth Jell-O  Grape juice                           Apple juice Coffee or tea                        Jell-O                                      Popsicle                                                Coffee or tea                        Coffee or tea   Complete one Ensure drink the  morning of surgery at 6:30AM the day of surgery.   Brush your teeth the morning of surgery.   Do NOT smoke after Midnight   Take these medicines the morning of surgery with A SIP OF WATER: Metoprolol, Omeprazole, Oxybutynin                               You may not have any metal on your body including hair pins, jewelry, and body piercings             Do not wear make-up, lotions, powders, perfumes/cologne, or deodorant             Do not wear nail polish.  Do not shave  48 hours prior to surgery.               Do not bring valuables to the hospital. [place].   Contacts, dentures or bridgework may not be worn into surgery.   Bring small overnight bag day of surgery.    Special Instructions: Bring a copy of your healthcare power of attorney and living will documents         the day of surgery if you haven't scanned them in before.              Please read over the following fact sheets you were given:  [facility] - Preparing for Surgery Before surgery, you can play an important role.  Because skin is not sterile, your skin needs to be as free of germs as possible.  You can reduce the number of germs on your skin by washing with CHG (chlorahexidine gluconate) soap before surgery.  CHG is an antiseptic cleaner which kills germs and bonds with the skin to continue killing germs even after washing. Please DO NOT use if you have an allergy to CHG or antibacterial soaps.  If your skin becomes reddened/irritated stop using the CHG and inform your nurse when you arrive at Short Stay. Do not shave (including legs and underarms) for at least 48 hours prior to the first CHG shower.  You may shave your face/neck.  Please follow these instructions carefully:  1.  Shower with CHG Soap the night before surgery and the  morning of surgery.  2.  If you choose to wash  your hair, wash your hair first as usual with your normal  shampoo.  3.  After you  shampoo, rinse your hair and body thoroughly to remove the shampoo.                             4.  Use CHG as you would any other liquid soap.  You can apply chg directly to the skin and wash.  Gently with a scrungie or clean washcloth.  5.  Apply the CHG Soap to your body ONLY FROM THE NECK DOWN.   Do   not use on face/ open                           Wound or open sores. Avoid contact with eyes, ears mouth and   genitals (private parts).                       Wash face,  Genitals (private parts) with your normal soap.             6.  Wash thoroughly, paying special attention to the area where your    surgery  will be performed.  7.  Thoroughly rinse your body with warm water from the neck down.  8.  DO NOT shower/wash with your normal soap after using and rinsing off the CHG Soap.                9.  Pat yourself dry with a clean towel.            10.  Wear clean pajamas.            11.  Place clean sheets on your bed the night of your first shower and do not  sleep with pets. Day of Surgery : Do not apply any lotions/deodorants the morning of surgery.  Please wear clean clothes to the hospital/surgery center.  FAILURE TO FOLLOW THESE INSTRUCTIONS MAY RESULT IN THE CANCELLATION OF YOUR SURGERY  PATIENT SIGNATURE_________________________________  NURSE SIGNATURE__________________________________  ________________________________________________________________________   Debbie Floyd  An incentive spirometer is a tool that can help keep your lungs clear and active. This tool measures how well you are filling your lungs with each breath. Taking long deep breaths may help reverse or decrease the chance of developing breathing (pulmonary) problems (especially infection) following:  A long period of time when you are unable to move or be active. BEFORE THE PROCEDURE   If the spirometer includes an indicator to show your best effort, your nurse or respiratory therapist will set it to  a desired goal.  If possible, sit up straight or lean slightly forward. Try not to slouch.  Hold the incentive spirometer in an upright position. INSTRUCTIONS FOR USE  1. Sit on the edge of your bed if possible, or sit up as far as you can in bed or on a chair. 2. Hold the incentive spirometer in an upright position. 3. Breathe out normally. 4. Place the mouthpiece in your mouth and seal your lips tightly around it. 5. Breathe in slowly and as deeply as possible, raising the piston or the ball toward the top of the column. 6. Hold your breath for 3-5 seconds or for as long as possible. Allow the piston or ball to fall to the bottom of the column. 7. Remove the mouthpiece from your mouth and breathe out normally. 8.  Rest for a few seconds and repeat Steps 1 through 7 at least 10 times every 1-2 hours when you are awake. Take your time and take a few normal breaths between deep breaths. 9. The spirometer may include an indicator to show your best effort. Use the indicator as a goal to work toward during each repetition. 10. After each set of 10 deep breaths, practice coughing to be sure your lungs are clear. If you have an incision (the cut made at the time of surgery), support your incision when coughing by placing a pillow or rolled up towels firmly against it. Once you are able to get out of bed, walk around indoors and cough well. You may stop using the incentive spirometer when instructed by your caregiver.  RISKS AND COMPLICATIONS  Take your time so you do not get dizzy or light-headed.  If you are in pain, you may need to take or ask for pain medication before doing incentive spirometry. It is harder to take a deep breath if you are having pain. AFTER USE  Rest and breathe slowly and easily.  It can be helpful to keep track of a log of your progress. Your caregiver can provide you with a simple table to help with this. If you are using the spirometer at home, follow these  instructions: [place] IF:   You are having difficultly using the spirometer.  You have trouble using the spirometer as often as instructed.  Your pain medication is not giving enough relief while using the spirometer.  You develop fever of 100.5 F (38.1 C) or higher. SEEK IMMEDIATE MEDICAL CARE IF:   You cough up bloody sputum that had not been present before.  You develop fever of 102 F (38.9 C) or greater.  You develop worsening pain at or near the incision site. MAKE SURE YOU:   Understand these instructions.  Will watch your condition.  Will get help right away if you are not doing well or get worse. Document Released: 07/02/2006 Document Revised: 05/14/2011 Document Reviewed: 09/02/2006 ExitCare Patient Information 2014 ExitCare, [place].   ________________________________________________________________________  WHAT IS A BLOOD TRANSFUSION? Blood Transfusion Information  A transfusion is the replacement of blood or some of its parts. Blood is made up of multiple cells which provide different functions.  Red blood cells carry oxygen and are used for blood loss replacement.  White blood cells fight against infection.  Platelets control bleeding.  Plasma helps clot blood.  Other blood products are available for specialized needs, such as hemophilia or other clotting disorders. BEFORE THE TRANSFUSION  Who gives blood for transfusions?   Healthy volunteers who are fully evaluated to make sure their blood is safe. This is blood bank blood. Transfusion therapy is the safest it has ever been in the practice of medicine. Before blood is taken from a donor, a complete history is taken to make sure that person has no history of diseases nor engages in risky social behavior (examples are intravenous drug use or sexual activity with multiple partners). The donor's travel history is screened to minimize risk of transmitting infections, such as malaria. The donated  blood is tested for signs of infectious diseases, such as HIV and hepatitis. The blood is then tested to be sure it is compatible with you in order to minimize the chance of a transfusion reaction. If you or a relative donates blood, this is often done in anticipation of surgery and is not appropriate for emergency situations. It takes  many days to process the donated blood. RISKS AND COMPLICATIONS Although transfusion therapy is very safe and saves many lives, the main dangers of transfusion include:   Getting an infectious disease.  Developing a transfusion reaction. This is an allergic reaction to something in the blood you were given. Every precaution is taken to prevent this. The decision to have a blood transfusion has been considered carefully by your caregiver before blood is given. Blood is not given unless the benefits outweigh the risks. AFTER THE TRANSFUSION  Right after receiving a blood transfusion, you will usually feel much better and more energetic. This is especially true if your red blood cells have gotten low (anemic). The transfusion raises the level of the red blood cells which carry oxygen, and this usually causes an energy increase.  The nurse administering the transfusion will monitor you carefully for complications. HOME CARE INSTRUCTIONS  No special instructions are needed after a transfusion. You may find your energy is better. Speak with your caregiver about any limitations on activity for underlying diseases you may have. SEEK MEDICAL CARE IF:   Your condition is not improving after your transfusion.  You develop redness or irritation at the intravenous (IV) site. SEEK IMMEDIATE MEDICAL CARE IF:  Any of the following symptoms occur over the next 12 hours:  Shaking chills.  You have a temperature by mouth above 102 F (38.9 C), not controlled by medicine.  Chest, back, or muscle pain.  People around you feel you are not acting correctly or are  confused.  Shortness of breath or difficulty breathing.  Dizziness and fainting.  You get a rash or develop hives.  You have a decrease in urine output.  Your urine turns a dark color or changes to pink, red, or brown. Any of the following symptoms occur over the next 10 days:  You have a temperature by mouth above 102 F (38.9 C), not controlled by medicine.  Shortness of breath.  Weakness after normal activity.  The white part of the eye turns yellow (jaundice).  You have a decrease in the amount of urine or are urinating less often.  Your urine turns a dark color or changes to pink, red, or brown. Document Released: 02/17/2000 Document Revised: 05/14/2011 Document Reviewed: 10/06/2007 [facility] Patient Information 2014 [place], [place].  _______________________________________________________________________

## 2018-12-10 ENCOUNTER — Encounter (HOSPITAL_COMMUNITY)
Admission: RE | Admit: 2018-12-10 | Discharge: 2018-12-10 | Disposition: A | Discharge status: Home / Self Care | Payer: Federal, State, Local not specified - PPO | Source: Ambulatory Visit | Attending: Orthopedic Surgery | Admitting: Orthopedic Surgery

## 2018-12-10 ENCOUNTER — Encounter (HOSPITAL_COMMUNITY): Payer: Self-pay

## 2018-12-10 ENCOUNTER — Other Ambulatory Visit: Payer: Self-pay

## 2018-12-10 DIAGNOSIS — Z01812 Encounter for preprocedural laboratory examination: Secondary | ICD-10-CM | POA: Insufficient documentation

## 2018-12-10 HISTORY — DX: Ulcerative colitis, unspecified, without complications: K51.90

## 2018-12-10 HISTORY — DX: Rosacea, unspecified: L71.9

## 2018-12-10 HISTORY — DX: Personal history of other specified conditions: Z87.898

## 2018-12-10 HISTORY — DX: Essential (primary) hypertension: I10

## 2018-12-10 LAB — CBC
HCT: 45.5 % (ref 36.0–46.0)
Hemoglobin: 14.8 g/dL (ref 12.0–15.0)
MCH: 31.6 pg (ref 26.0–34.0)
MCHC: 32.5 g/dL (ref 30.0–36.0)
MCV: 97.2 fL (ref 80.0–100.0)
Platelets: 293 10*3/uL (ref 150–400)
RBC: 4.68 MIL/uL (ref 3.87–5.11)
RDW: 13.2 % (ref 11.5–15.5)
WBC: 6.3 10*3/uL (ref 4.0–10.5)
nRBC: 0 % (ref 0.0–0.2)

## 2018-12-10 LAB — COMPREHENSIVE METABOLIC PANEL
ALT: 20 U/L (ref 0–44)
AST: 19 U/L (ref 15–41)
Albumin: 4 g/dL (ref 3.5–5.0)
Alkaline Phosphatase: 62 U/L (ref 38–126)
Anion gap: 8 (ref 5–15)
BUN: 17 mg/dL (ref 8–23)
CO2: 25 mmol/L (ref 22–32)
Calcium: 8.7 mg/dL — ABNORMAL LOW (ref 8.9–10.3)
Chloride: 109 mmol/L (ref 98–111)
Creatinine, Ser: 0.83 mg/dL (ref 0.44–1.00)
GFR calc Af Amer: >60 mL/min (ref >60)
GFR calc non Af Amer: >60 mL/min (ref >60)
Glucose, Bld: 107 mg/dL — ABNORMAL HIGH (ref 70–99)
Potassium: 3.5 mmol/L (ref 3.5–5.1)
Sodium: 142 mmol/L (ref 135–145)
Total Bilirubin: 0.8 mg/dL (ref 0.3–1.2)
Total Protein: 7.1 g/dL (ref 6.5–8.1)

## 2018-12-10 LAB — SURGICAL PCR SCREEN
MRSA, PCR: NEGATIVE
Staphylococcus aureus: POSITIVE — AB

## 2018-12-10 LAB — PROTIME-INR
INR: 1 (ref 0.8–1.2)
Prothrombin Time: 12.8 seconds (ref 11.4–15.2)

## 2018-12-10 LAB — APTT: aPTT: 30 seconds (ref 24–36)

## 2018-12-10 NOTE — H&P (Signed)
TOTAL KNEE ADMISSION H&P  Patient is being admitted for right total knee arthroplasty.  Subjective:  Chief Complaint:right knee pain.  HPI: Debbie Floyd, 63 y.o. female, has a history of pain and functional disability in the right knee due to arthritis and has failed non-surgical conservative treatments for greater than 12 weeks to includecorticosteriod injections, viscosupplementation injections and activity modification.  Onset of symptoms was gradual, starting 1 years ago with gradually worsening course since that time. The patient noted no past surgery on the right knee(s).  Patient currently rates pain in the right knee(s) at 8 out of 10 with activity. Patient has worsening of pain with activity and weight bearing, pain that interferes with activities of daily living and joint swelling.  Patient has evidence of bone-on-bone arthritis in the medial compartment of the right knee with some tibial subluxation. by imaging studies.  There is no active infection.  Patient Active Problem List   Diagnosis Date Noted  . Essential hypertension 05/08/2018  . History of total knee replacement, left 10/15/2017  . OA (osteoarthritis) of knee 09/09/2017  . Pure hypercholesterolemia 12/28/2014  . Breast cancer of upper-outer quadrant of left female breast ([place]) 09/14/2014  . Acid reflux 09/14/2014  . History of palpitations 09/14/2014  . H/O malignant neoplasm of breast 09/14/2014  . Acne erythematosa 09/14/2014  . Colitis gravis ([place]) 09/14/2014  . Gastroesophageal reflux disease 09/14/2014  . Rosacea 09/14/2014  . HLD (hyperlipidemia) 11/23/2013   Past Medical History:  Diagnosis Date  . Arthritis    RIGHT KNEE  . Breast cancer ([place]) 2014   lumpectomy lt  . Cancer of left breast ([place]) 09/14/2014  . Dysrhythmia    PALPITATIONS-WELL CONTROLLED ON METOPROLOL (07-19-15)  . GERD (gastroesophageal reflux disease)   . History of palpitations   . Hypercholesterolemia   . Hypertension   .  Overactive bladder   . Personal history of radiation therapy   . Rosacea   . Ulcerative colitis [facility])     Past Surgical History:  Procedure Laterality Date  . 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 BREAST LUMPECTOMY Left 2014   with radiation  . CHOLECYSTECTOMY N/A 07/26/2015   Procedure: LAPAROSCOPIC CHOLECYSTECTOMY WITH INTRAOPERATIVE CHOLANGIOGRAM;  Surgeon: Leonie Green, MD;  Location: [facility] ORS;  Service: General;  Laterality: N/A;  . COLONOSCOPY    . CRYOABLATION      of uterus  . MASTECTOMY Left    Partial  . OOPHORECTOMY Bilateral 2014  . TOTAL KNEE ARTHROPLASTY Left 09/09/2017   Procedure: LEFT TOTAL KNEE ARTHROPLASTY;  Surgeon: Gaynelle Arabian, MD;  Location: [facility] ORS;  Service: Orthopedics;  Laterality: Left;  Adductor Block    No current facility-administered medications for this encounter.    Current Outpatient Medications  Medication Sig Dispense Refill Last Dose  . atorvastatin (LIPITOR) 10 MG tablet Take 5 mg by mouth every evening.     . Calcium Carb-Cholecalciferol (CALCIUM + D3) 600-200 MG-UNIT TABS TAKE 1 TABLET BY MOUTH TWICE A DAY (Patient taking differently: Take 1 tablet by mouth 2 (two) times daily. ) 150 tablet 5   . Cranberry 450 MG TABS Take 900 mg by mouth daily.     Marland Kitchen doxycycline (VIBRAMYCIN) 100 MG capsule Take 100 mg by mouth 2 (two) times a week.      Marland Kitchen lisinopril (ZESTRIL) 10 MG tablet Take 10 mg by mouth daily.      . mesalamine (LIALDA) 1.2 G EC tablet Take 2.4 g by mouth  daily with breakfast.      . metoprolol succinate (TOPROL-XL) 50 MG 24 hr tablet Take 50 mg by mouth every morning.      . Multiple Vitamin (MULTIVITAMIN WITH MINERALS) TABS tablet Take 1 tablet by mouth daily.     Marland Kitchen omeprazole (PRILOSEC) 20 MG capsule Take 20 mg by mouth 2 (two) times daily.      . OSPHENA 60 MG TABS Take 60 mg by mouth daily.      Marland Kitchen oxybutynin (DITROPAN) 5 MG tablet Take 5 mg by mouth every morning.       Allergies  Allergen Reactions  .  Adhesive [Tape] Rash    IF LEFT ON FOR LONG PERIOD OF TIME  . Penicillins Rash and Other (See Comments)    febrile Has patient had a PCN reaction causing immediate rash, facial/tongue/throat swelling, SOB or lightheadedness with hypotension: Yes Has patient had a PCN reaction causing severe rash involving mucus membranes or skin necrosis: No Has patient had a PCN reaction that required hospitalization: Yes Has patient had a PCN reaction occurring within the last 10 years: No If all of the above answers are "NO", then may proceed with Cephalosporin use.   . Sulfa Antibiotics Other (See Comments) and Rash    febrile    Social History   Tobacco Use  . Smoking status: Never Smoker  . Smokeless tobacco: Never Used  Substance Use Topics  . Alcohol use: Never    Frequency: Never    Comment: seldom     Family History  Problem Relation Age of Onset  . Breast cancer Paternal Aunt   . Ovarian cancer Maternal Grandmother 80  . Breast cancer Cousin   . Breast cancer Cousin      Review of Systems  Constitutional: Negative for chills and fever.  Respiratory: Negative for cough and shortness of breath.   Cardiovascular: Negative for chest pain and palpitations.  Gastrointestinal: Negative for nausea and vomiting.  Musculoskeletal: Positive for joint pain.    Objective:  Physical Exam Patient is a 63 year old female.  Well nourished and well developed. General: Alert and oriented x3, cooperative and pleasant, no acute distress. Head: normocephalic, atraumatic, neck supple. Eyes: EOMI. Respiratory: breath sounds clear in all fields, no wheezing, rales, or rhonchi. Cardiovascular: Regular rate and rhythm, no murmurs, gallops or rubs. Abdomen: non-tender to palpation and soft, normoactive bowel sounds.  Musculoskeletal: Right Knee Exam: No effusion present. No swelling present. There is a large Baker's cyst. The range of motion is: 5 to 130 degrees. Moderate crepitus on range of  motion of the knee. Medial joint line tenderness. No lateral joint line tenderness. The knee is stable.  Calves soft and nontender. Motor function intact in LE. Strength 5/5 LE bilaterally. Neuro: Distal pulses 2+. Sensation to light touch intact in LE.  Vital signs in last 24 hours: Labs:   Estimated body mass index is 25.63 kg/m as calculated from the following:   Height as of 10/21/17: 5' 5"  (1.651 m).   Weight as of 10/21/17: 69.9 kg.   Imaging Review Plain radiographs demonstrate severe degenerative joint disease of the right knee(s). The overall alignment isneutral. The bone quality appears to be adequate for age and reported activity level.  Assessment/Plan:  End stage arthritis, right knee   The patient history, physical examination, clinical judgment of the provider and imaging studies are consistent with end stage degenerative joint disease of the right knee(s) and total knee arthroplasty is deemed medically necessary. The treatment  options including medical management, injection therapy arthroscopy and arthroplasty were discussed at length. The risks and benefits of total knee arthroplasty were presented and reviewed. The risks due to aseptic loosening, infection, stiffness, patella tracking problems, thromboembolic complications and other imponderables were discussed. The patient acknowledged the explanation, agreed to proceed with the plan and consent was signed. Patient is being admitted for inpatient treatment for surgery, pain control, PT, OT, prophylactic antibiotics, VTE prophylaxis, progressive ambulation and ADL's and discharge planning. The patient is planning to be discharged home.  Therapy Plans: outpatient therapy at [facility] in [place] Disposition: Home with husband Planned DVT Prophylaxis: Xarelto 28m daily (hx breast cancer) DME needed: none PCP: Dr. BBaldemar Lenis clearance recieved TXA: IV Allergies: PCN - rash and fever, Sulfa - rash and fever >> combination  of these resulted in hospitalization Anesthesia Concerns: none BMI: 26.1  Other: Hx of ulcerative colitis. Nausea with oxycodone, took tramadol with left TKA last year. Hx of breast cancer, s/p lumpectomy and radiation therapy in 2014.   Patient's anticipated LOS is less than 2 midnights, meeting these requirements: - Younger than 641- Lives within 1 hour of care - Has a competent adult at home to recover with post-op recover - NO history of  - Chronic pain requiring opiods  - Diabetes  - Coronary Artery Disease  - Heart failure  - Heart attack  - Stroke  - DVT/VTE  - Cardiac arrhythmia  - Respiratory Failure/COPD  - Renal failure  - Anemia  - Advanced Liver disease  - Patient was instructed on what medications to stop prior to surgery. - Follow-up visit in 2 weeks with Dr. AWynelle Link- Begin physical therapy following surgery - Pre-operative lab work as pre-surgical testing - Prescriptions will be provided in hospital at time of discharge  AGriffith Citron PA-C Orthopedic Surgery [facility] [place]([phone]

## 2018-12-10 NOTE — Progress Notes (Signed)
PCP - Dr. Warrick Parisian last office visit note 11/07/2018 in epic Cardiologist - N/A  Chest x-ray -N/A  EKG - 11/07/2018 in epic Stress Test - N/A ECHO - N/A Cardiac Cath - N/A  Sleep Study - N/A CPAP - N/A  Fasting Blood Sugar - N/A Checks Blood Sugar _N/A____ times a day  Blood Thinner Instructions:  N/A Aspirin Instructions:  N/A Last Dose:  N/A   Anesthesia review:  N/A  Patient denies shortness of breath, fever, cough and chest pain at PAT appointment   Patient verbalized understanding of instructions that were given to them at the PAT appointment. Patient was also instructed that they will need to review over the PAT instructions again at home before surgery.

## 2018-12-10 NOTE — Progress Notes (Signed)
SPOKE W/  [place]     SCREENING SYMPTOMS OF COVID 19:   COUGH--NO  RUNNY NOSE--- NO  SORE THROAT---NO  NASAL CONGESTION----NO  SNEEZING----NO  SHORTNESS OF BREATH---NO  DIFFICULTY BREATHING---NO  TEMP >100.0 -----NO  UNEXPLAINED BODY ACHES------NO  CHILLS -------- NO  HEADACHES ---------NO  LOSS OF SMELL/ TASTE --------NO    HAVE YOU OR ANY FAMILY MEMBER TRAVELLED PAST 14 DAYS OUT OF THE   COUNTY---Lives in [facility] travelled to [place] STATE----NO COUNTRY----NO  HAVE YOU OR ANY FAMILY MEMBER BEEN EXPOSED TO ANYONE WITH COVID 19? NO

## 2018-12-11 ENCOUNTER — Other Ambulatory Visit
Admission: RE | Admit: 2018-12-11 | Discharge: 2018-12-11 | Disposition: A | Discharge status: Home / Self Care | Payer: Federal, State, Local not specified - PPO | Source: Ambulatory Visit | Attending: Orthopedic Surgery | Admitting: Orthopedic Surgery

## 2018-12-11 DIAGNOSIS — Z20828 Contact with and (suspected) exposure to other viral communicable diseases: Secondary | ICD-10-CM | POA: Diagnosis not present

## 2018-12-11 DIAGNOSIS — Z01812 Encounter for preprocedural laboratory examination: Secondary | ICD-10-CM | POA: Insufficient documentation

## 2018-12-11 LAB — SARS CORONAVIRUS 2 (TAT 6-24 HRS): SARS Coronavirus 2: NEGATIVE

## 2018-12-14 MED ORDER — BUPIVACAINE LIPOSOME 1.3 % IJ SUSP
20.0000 mL | Freq: Once | INTRAMUSCULAR | Status: DC
  Filled 2018-12-14: qty 20

## 2018-12-15 ENCOUNTER — Inpatient Hospital Stay (HOSPITAL_COMMUNITY)
Admission: RE | Admit: 2018-12-15 | Discharge: 2018-12-16 | DRG: 470 | Disposition: A | Discharge status: Home / Self Care | Payer: Federal, State, Local not specified - PPO | Attending: Orthopedic Surgery | Admitting: Orthopedic Surgery

## 2018-12-15 ENCOUNTER — Encounter (HOSPITAL_COMMUNITY): Payer: Self-pay | Admitting: *Deleted

## 2018-12-15 ENCOUNTER — Inpatient Hospital Stay (HOSPITAL_COMMUNITY): Payer: Federal, State, Local not specified - PPO | Admitting: Anesthesiology

## 2018-12-15 ENCOUNTER — Encounter (HOSPITAL_COMMUNITY)
Admission: RE | Disposition: A | Discharge status: Home / Self Care | Payer: Self-pay | Source: Home / Self Care | Attending: Orthopedic Surgery

## 2018-12-15 ENCOUNTER — Other Ambulatory Visit: Payer: Self-pay

## 2018-12-15 ENCOUNTER — Inpatient Hospital Stay (HOSPITAL_COMMUNITY): Payer: Federal, State, Local not specified - PPO | Admitting: Physician Assistant

## 2018-12-15 DIAGNOSIS — Z853 Personal history of malignant neoplasm of breast: Secondary | ICD-10-CM | POA: Diagnosis not present

## 2018-12-15 DIAGNOSIS — E785 Hyperlipidemia, unspecified: Secondary | ICD-10-CM | POA: Diagnosis present

## 2018-12-15 DIAGNOSIS — E7800 Pure hypercholesterolemia, unspecified: Secondary | ICD-10-CM | POA: Diagnosis present

## 2018-12-15 DIAGNOSIS — I959 Hypotension, unspecified: Secondary | ICD-10-CM | POA: Diagnosis not present

## 2018-12-15 DIAGNOSIS — Z79899 Other long term (current) drug therapy: Secondary | ICD-10-CM

## 2018-12-15 DIAGNOSIS — Z91048 Other nonmedicinal substance allergy status: Secondary | ICD-10-CM

## 2018-12-15 DIAGNOSIS — I10 Essential (primary) hypertension: Secondary | ICD-10-CM | POA: Diagnosis present

## 2018-12-15 DIAGNOSIS — K219 Gastro-esophageal reflux disease without esophagitis: Secondary | ICD-10-CM | POA: Diagnosis present

## 2018-12-15 DIAGNOSIS — Z20828 Contact with and (suspected) exposure to other viral communicable diseases: Secondary | ICD-10-CM | POA: Diagnosis present

## 2018-12-15 DIAGNOSIS — R002 Palpitations: Secondary | ICD-10-CM | POA: Diagnosis present

## 2018-12-15 DIAGNOSIS — K5190 Ulcerative colitis, unspecified, without complications: Secondary | ICD-10-CM | POA: Diagnosis present

## 2018-12-15 DIAGNOSIS — L719 Rosacea, unspecified: Secondary | ICD-10-CM | POA: Diagnosis present

## 2018-12-15 DIAGNOSIS — M179 Osteoarthritis of knee, unspecified: Secondary | ICD-10-CM | POA: Diagnosis present

## 2018-12-15 DIAGNOSIS — Z882 Allergy status to sulfonamides status: Secondary | ICD-10-CM | POA: Diagnosis not present

## 2018-12-15 DIAGNOSIS — M1710 Unilateral primary osteoarthritis, unspecified knee: Secondary | ICD-10-CM | POA: Diagnosis present

## 2018-12-15 DIAGNOSIS — Z803 Family history of malignant neoplasm of breast: Secondary | ICD-10-CM

## 2018-12-15 DIAGNOSIS — Z923 Personal history of irradiation: Secondary | ICD-10-CM

## 2018-12-15 DIAGNOSIS — Z8041 Family history of malignant neoplasm of ovary: Secondary | ICD-10-CM

## 2018-12-15 DIAGNOSIS — M1711 Unilateral primary osteoarthritis, right knee: Secondary | ICD-10-CM | POA: Diagnosis present

## 2018-12-15 DIAGNOSIS — N3281 Overactive bladder: Secondary | ICD-10-CM | POA: Diagnosis present

## 2018-12-15 DIAGNOSIS — Z880 Allergy status to penicillin: Secondary | ICD-10-CM | POA: Diagnosis not present

## 2018-12-15 DIAGNOSIS — Z96652 Presence of left artificial knee joint: Secondary | ICD-10-CM | POA: Diagnosis present

## 2018-12-15 HISTORY — PX: TOTAL KNEE ARTHROPLASTY: SHX125

## 2018-12-15 LAB — TYPE AND SCREEN
ABO/RH(D): A POS
Antibody Screen: NEGATIVE

## 2018-12-15 SURGERY — ARTHROPLASTY, KNEE, TOTAL
Anesthesia: Regional | Site: Knee | Laterality: Right

## 2018-12-15 MED ORDER — PHENOL 1.4 % MT LIQD: 1.0000 | OROMUCOSAL | Status: DC | PRN

## 2018-12-15 MED ORDER — HYDROMORPHONE HCL 2 MG PO TABS
2.0000 mg | ORAL_TABLET | ORAL | Status: DC | PRN
  Administered 2018-12-15 – 2018-12-16 (×3): 2 mg via ORAL
  Filled 2018-12-15 (×3): qty 1

## 2018-12-15 MED ORDER — OXYBUTYNIN CHLORIDE 5 MG PO TABS
5.0000 mg | ORAL_TABLET | ORAL | Status: DC
  Administered 2018-12-16: 5 mg via ORAL
  Filled 2018-12-15: qty 1

## 2018-12-15 MED ORDER — RIVAROXABAN 10 MG PO TABS
10.0000 mg | ORAL_TABLET | Freq: Every day | ORAL | Status: DC
  Administered 2018-12-16: 10 mg via ORAL
  Filled 2018-12-15: qty 1

## 2018-12-15 MED ORDER — SODIUM CHLORIDE 0.9 % IV SOLN
INTRAVENOUS | Status: DC
  Administered 2018-12-15: 12:00:00 via INTRAVENOUS

## 2018-12-15 MED ORDER — SODIUM CHLORIDE (PF) 0.9 % IJ SOLN
INTRAMUSCULAR | Status: AC
  Filled 2018-12-15: qty 50

## 2018-12-15 MED ORDER — SODIUM CHLORIDE (PF) 0.9 % IJ SOLN
INTRAMUSCULAR | Status: AC
  Filled 2018-12-15: qty 10

## 2018-12-15 MED ORDER — METOCLOPRAMIDE HCL 5 MG/ML IJ SOLN
5.0000 mg | Freq: Three times a day (TID) | INTRAMUSCULAR | Status: DC | PRN
  Administered 2018-12-15: 10 mg via INTRAVENOUS
  Filled 2018-12-15: qty 2

## 2018-12-15 MED ORDER — OSPEMIFENE 60 MG PO TABS: 60.0000 mg | ORAL_TABLET | Freq: Every day | ORAL | Status: DC

## 2018-12-15 MED ORDER — ONDANSETRON HCL 4 MG/2ML IJ SOLN
4.0000 mg | Freq: Four times a day (QID) | INTRAMUSCULAR | Status: DC | PRN
  Administered 2018-12-15: 4 mg via INTRAVENOUS
  Filled 2018-12-15: qty 2

## 2018-12-15 MED ORDER — DOCUSATE SODIUM 100 MG PO CAPS
100.0000 mg | ORAL_CAPSULE | Freq: Two times a day (BID) | ORAL | Status: DC
  Administered 2018-12-16: 100 mg via ORAL
  Filled 2018-12-15 (×2): qty 1

## 2018-12-15 MED ORDER — SODIUM CHLORIDE 0.9 % IV BOLUS
500.0000 mL | Freq: Once | INTRAVENOUS | Status: AC
  Administered 2018-12-15: 500 mL via INTRAVENOUS

## 2018-12-15 MED ORDER — PROPOFOL 10 MG/ML IV BOLUS
INTRAVENOUS | Status: AC
  Filled 2018-12-15: qty 20

## 2018-12-15 MED ORDER — MENTHOL 3 MG MT LOZG: 1.0000 | LOZENGE | OROMUCOSAL | Status: DC | PRN

## 2018-12-15 MED ORDER — FENTANYL CITRATE (PF) 100 MCG/2ML IJ SOLN: 25.0000 ug | INTRAMUSCULAR | Status: DC | PRN

## 2018-12-15 MED ORDER — BUPIVACAINE LIPOSOME 1.3 % IJ SUSP
INTRAMUSCULAR | Status: DC | PRN
  Administered 2018-12-15: 20 mL

## 2018-12-15 MED ORDER — DEXAMETHASONE SODIUM PHOSPHATE 10 MG/ML IJ SOLN
10.0000 mg | Freq: Once | INTRAMUSCULAR | Status: AC
  Administered 2018-12-16: 10 mg via INTRAVENOUS
  Filled 2018-12-15: qty 1

## 2018-12-15 MED ORDER — DIPHENHYDRAMINE HCL 12.5 MG/5ML PO ELIX: 12.5000 mg | ORAL_SOLUTION | ORAL | Status: DC | PRN

## 2018-12-15 MED ORDER — FENTANYL CITRATE (PF) 100 MCG/2ML IJ SOLN
50.0000 ug | INTRAMUSCULAR | Status: DC
  Administered 2018-12-15: 50 ug via INTRAVENOUS
  Filled 2018-12-15: qty 2

## 2018-12-15 MED ORDER — ACETAMINOPHEN 10 MG/ML IV SOLN
1000.0000 mg | Freq: Four times a day (QID) | INTRAVENOUS | Status: DC
  Administered 2018-12-15: 10:00:00 1000 mg via INTRAVENOUS
  Filled 2018-12-15: qty 100

## 2018-12-15 MED ORDER — STERILE WATER FOR IRRIGATION IR SOLN
Status: DC | PRN
  Administered 2018-12-15: 2000 mL

## 2018-12-15 MED ORDER — CHLORHEXIDINE GLUCONATE 4 % EX LIQD: 60.0000 mL | Freq: Once | CUTANEOUS | Status: DC

## 2018-12-15 MED ORDER — PROPOFOL 500 MG/50ML IV EMUL
INTRAVENOUS | Status: DC | PRN
  Administered 2018-12-15: 25 ug/kg/min via INTRAVENOUS

## 2018-12-15 MED ORDER — HYDROMORPHONE HCL 2 MG PO TABS
4.0000 mg | ORAL_TABLET | ORAL | Status: DC | PRN
  Administered 2018-12-15: 4 mg via ORAL
  Filled 2018-12-15: qty 2

## 2018-12-15 MED ORDER — MIDAZOLAM HCL 2 MG/2ML IJ SOLN
1.0000 mg | INTRAMUSCULAR | Status: DC
  Administered 2018-12-15: 1 mg via INTRAVENOUS
  Filled 2018-12-15: qty 2

## 2018-12-15 MED ORDER — PROPOFOL 10 MG/ML IV BOLUS
INTRAVENOUS | Status: DC | PRN
  Administered 2018-12-15: 20 ug via INTRAVENOUS

## 2018-12-15 MED ORDER — PANTOPRAZOLE SODIUM 40 MG PO TBEC
40.0000 mg | DELAYED_RELEASE_TABLET | Freq: Two times a day (BID) | ORAL | Status: DC
  Administered 2018-12-15 – 2018-12-16 (×2): 40 mg via ORAL
  Filled 2018-12-15 (×2): qty 1

## 2018-12-15 MED ORDER — ONDANSETRON HCL 4 MG PO TABS
4.0000 mg | ORAL_TABLET | Freq: Four times a day (QID) | ORAL | Status: DC | PRN
  Administered 2018-12-16: 4 mg via ORAL
  Filled 2018-12-15: qty 1

## 2018-12-15 MED ORDER — METHOCARBAMOL 500 MG IVPB - SIMPLE MED
500.0000 mg | Freq: Four times a day (QID) | INTRAVENOUS | Status: DC | PRN
  Filled 2018-12-15: qty 50

## 2018-12-15 MED ORDER — VANCOMYCIN HCL IN DEXTROSE 1-5 GM/200ML-% IV SOLN
1000.0000 mg | INTRAVENOUS | Status: AC
  Administered 2018-12-15: 1000 mg via INTRAVENOUS
  Filled 2018-12-15: qty 200

## 2018-12-15 MED ORDER — CEFAZOLIN SODIUM-DEXTROSE 1-4 GM/50ML-% IV SOLN
1.0000 g | Freq: Four times a day (QID) | INTRAVENOUS | Status: AC
  Administered 2018-12-15 (×2): 1 g via INTRAVENOUS
  Filled 2018-12-15 (×2): qty 50

## 2018-12-15 MED ORDER — PHENYLEPHRINE HCL (PRESSORS) 10 MG/ML IV SOLN
INTRAVENOUS | Status: AC
  Filled 2018-12-15: qty 1

## 2018-12-15 MED ORDER — ROPIVACAINE HCL 5 MG/ML IJ SOLN
INTRAMUSCULAR | Status: DC | PRN
  Administered 2018-12-15: 20 mL via PERINEURAL

## 2018-12-15 MED ORDER — METOCLOPRAMIDE HCL 5 MG PO TABS: 5.0000 mg | ORAL_TABLET | Freq: Three times a day (TID) | ORAL | Status: DC | PRN

## 2018-12-15 MED ORDER — FLEET ENEMA 7-19 GM/118ML RE ENEM: 1.0000 | ENEMA | Freq: Once | RECTAL | Status: DC | PRN

## 2018-12-15 MED ORDER — POLYETHYLENE GLYCOL 3350 17 G PO PACK: 17.0000 g | PACK | Freq: Every day | ORAL | Status: DC | PRN

## 2018-12-15 MED ORDER — DEXAMETHASONE SODIUM PHOSPHATE 4 MG/ML IJ SOLN
INTRAMUSCULAR | Status: DC | PRN
  Administered 2018-12-15: 4 mg via PERINEURAL

## 2018-12-15 MED ORDER — GABAPENTIN 300 MG PO CAPS
300.0000 mg | ORAL_CAPSULE | Freq: Three times a day (TID) | ORAL | Status: DC
  Administered 2018-12-15 – 2018-12-16 (×3): 300 mg via ORAL
  Filled 2018-12-15 (×3): qty 1

## 2018-12-15 MED ORDER — HYDROMORPHONE HCL 1 MG/ML IJ SOLN: 0.5000 mg | INTRAMUSCULAR | Status: DC | PRN

## 2018-12-15 MED ORDER — METOPROLOL SUCCINATE ER 50 MG PO TB24
50.0000 mg | ORAL_TABLET | Freq: Every day | ORAL | Status: DC
  Administered 2018-12-16: 50 mg via ORAL
  Filled 2018-12-15: qty 1

## 2018-12-15 MED ORDER — 0.9 % SODIUM CHLORIDE (POUR BTL) OPTIME
TOPICAL | Status: DC | PRN
  Administered 2018-12-15: 1000 mL

## 2018-12-15 MED ORDER — MESALAMINE 1.2 G PO TBEC
2.4000 g | DELAYED_RELEASE_TABLET | Freq: Every day | ORAL | Status: DC
  Administered 2018-12-16: 2.4 g via ORAL
  Filled 2018-12-15: qty 2

## 2018-12-15 MED ORDER — LACTATED RINGERS IV SOLN
INTRAVENOUS | Status: DC
  Administered 2018-12-15 (×2): via INTRAVENOUS

## 2018-12-15 MED ORDER — METHOCARBAMOL 500 MG PO TABS
500.0000 mg | ORAL_TABLET | Freq: Four times a day (QID) | ORAL | Status: DC | PRN
  Administered 2018-12-15 (×2): 500 mg via ORAL
  Filled 2018-12-15 (×2): qty 1

## 2018-12-15 MED ORDER — BUPIVACAINE IN DEXTROSE 0.75-8.25 % IT SOLN
INTRATHECAL | Status: DC | PRN
  Administered 2018-12-15: 1.4 mL via INTRATHECAL

## 2018-12-15 MED ORDER — ACETAMINOPHEN 500 MG PO TABS
1000.0000 mg | ORAL_TABLET | Freq: Four times a day (QID) | ORAL | Status: DC
  Administered 2018-12-15 – 2018-12-16 (×3): 1000 mg via ORAL
  Filled 2018-12-15 (×4): qty 2

## 2018-12-15 MED ORDER — TRANEXAMIC ACID-NACL 1000-0.7 MG/100ML-% IV SOLN
1000.0000 mg | INTRAVENOUS | Status: AC
  Administered 2018-12-15: 1000 mg via INTRAVENOUS
  Filled 2018-12-15: qty 100

## 2018-12-15 MED ORDER — SODIUM CHLORIDE 0.9 % IV SOLN
INTRAVENOUS | Status: DC | PRN
  Administered 2018-12-15: 09:00:00 50 ug/min via INTRAVENOUS

## 2018-12-15 MED ORDER — PHENYLEPHRINE 40 MCG/ML (10ML) SYRINGE FOR IV PUSH (FOR BLOOD PRESSURE SUPPORT)
PREFILLED_SYRINGE | INTRAVENOUS | Status: AC
  Filled 2018-12-15: qty 10

## 2018-12-15 MED ORDER — SODIUM CHLORIDE (PF) 0.9 % IJ SOLN
INTRAMUSCULAR | Status: DC | PRN
  Administered 2018-12-15: 60 mL

## 2018-12-15 MED ORDER — ATORVASTATIN CALCIUM 10 MG PO TABS
5.0000 mg | ORAL_TABLET | Freq: Every evening | ORAL | Status: DC
  Administered 2018-12-15: 5 mg via ORAL
  Filled 2018-12-15: qty 1

## 2018-12-15 MED ORDER — POVIDONE-IODINE 10 % EX SWAB: 2.0000 "application " | Freq: Once | CUTANEOUS | Status: DC

## 2018-12-15 MED ORDER — DEXAMETHASONE SODIUM PHOSPHATE 10 MG/ML IJ SOLN: 8.0000 mg | Freq: Once | INTRAMUSCULAR | Status: DC

## 2018-12-15 MED ORDER — SODIUM CHLORIDE 0.9 % IR SOLN
Status: DC | PRN
  Administered 2018-12-15: 1000 mL

## 2018-12-15 MED ORDER — BISACODYL 10 MG RE SUPP: 10.0000 mg | Freq: Every day | RECTAL | Status: DC | PRN

## 2018-12-15 SURGICAL SUPPLY — 62 items
ATTUNE MED DOME PAT 32 KNEE (Knees) ×2 IMPLANT
ATTUNE MED DOME PAT 32MM KNEE (Knees) ×1 IMPLANT
ATTUNE PSFEM RTSZ4 NARCEM KNEE (Femur) ×3 IMPLANT
ATTUNE PSRP INSR SZ4 7 KNEE (Insert) ×2 IMPLANT
ATTUNE PSRP INSR SZ4 7MM KNEE (Insert) ×1 IMPLANT
BAG ZIPLOCK 12X15 (MISCELLANEOUS) ×3 IMPLANT
BASE TIBIAL ROT PLAT SZ 3 KNEE (Knees) ×1 IMPLANT
BLADE SAG 18X100X1.27 (BLADE) ×3 IMPLANT
BLADE SAW SGTL 11.0X1.19X90.0M (BLADE) ×3 IMPLANT
BLADE SURG SZ10 CARB STEEL (BLADE) ×6 IMPLANT
BNDG COHESIVE 4X5 TAN STRL (GAUZE/BANDAGES/DRESSINGS) ×3 IMPLANT
BNDG ELASTIC 6X5.8 VLCR STR LF (GAUZE/BANDAGES/DRESSINGS) ×3 IMPLANT
BOWL SMART MIX CTS (DISPOSABLE) ×3 IMPLANT
CEMENT HV SMART SET (Cement) ×6 IMPLANT
CLOSURE STERI-STRIP 1/2X4 (GAUZE/BANDAGES/DRESSINGS) ×2
CLOSURE WOUND 1/2 X4 (GAUZE/BANDAGES/DRESSINGS) ×2
CLSR STERI-STRIP ANTIMIC 1/2X4 (GAUZE/BANDAGES/DRESSINGS) ×4 IMPLANT
COVER SURGICAL LIGHT HANDLE (MISCELLANEOUS) ×3 IMPLANT
COVER WAND RF STERILE (DRAPES) ×3 IMPLANT
CUFF TOURN SGL QUICK 34 (TOURNIQUET CUFF) ×2
CUFF TRNQT CYL 34X4.125X (TOURNIQUET CUFF) ×1 IMPLANT
DECANTER SPIKE VIAL GLASS SM (MISCELLANEOUS) ×3 IMPLANT
DRAPE U-SHAPE 47X51 STRL (DRAPES) ×3 IMPLANT
DRSG ADAPTIC 3X8 NADH LF (GAUZE/BANDAGES/DRESSINGS) ×3 IMPLANT
DRSG PAD ABDOMINAL 8X10 ST (GAUZE/BANDAGES/DRESSINGS) ×3 IMPLANT
DURAPREP 26ML APPLICATOR (WOUND CARE) ×3 IMPLANT
ELECT REM PT RETURN 15FT ADLT (MISCELLANEOUS) ×3 IMPLANT
EVACUATOR 1/8 PVC DRAIN (DRAIN) ×3 IMPLANT
GAUZE SPONGE 4X4 12PLY STRL (GAUZE/BANDAGES/DRESSINGS) ×3 IMPLANT
GLOVE BIO SURGEON STRL SZ7 (GLOVE) ×3 IMPLANT
GLOVE BIO SURGEON STRL SZ8 (GLOVE) ×3 IMPLANT
GLOVE BIOGEL PI IND STRL 6.5 (GLOVE) ×1 IMPLANT
GLOVE BIOGEL PI IND STRL 7.0 (GLOVE) ×1 IMPLANT
GLOVE BIOGEL PI IND STRL 8 (GLOVE) ×1 IMPLANT
GLOVE BIOGEL PI INDICATOR 6.5 (GLOVE) ×2
GLOVE BIOGEL PI INDICATOR 7.0 (GLOVE) ×2
GLOVE BIOGEL PI INDICATOR 8 (GLOVE) ×2
GLOVE SURG SS PI 6.5 STRL IVOR (GLOVE) ×3 IMPLANT
GOWN STRL REUS W/TWL LRG LVL3 (GOWN DISPOSABLE) ×9 IMPLANT
HANDPIECE INTERPULSE COAX TIP (DISPOSABLE) ×2
HOLDER FOLEY CATH W/STRAP (MISCELLANEOUS) IMPLANT
IMMOBILIZER KNEE 20 (SOFTGOODS) ×3
IMMOBILIZER KNEE 20 THIGH 36 (SOFTGOODS) ×1 IMPLANT
KIT TURNOVER KIT A (KITS) IMPLANT
MANIFOLD NEPTUNE II (INSTRUMENTS) ×3 IMPLANT
NS IRRIG 1000ML POUR BTL (IV SOLUTION) ×3 IMPLANT
PACK TOTAL KNEE CUSTOM (KITS) ×3 IMPLANT
PADDING CAST COTTON 6X4 STRL (CAST SUPPLIES) ×6 IMPLANT
PIN DRILL FIX HALF THREAD (BIT) ×3 IMPLANT
PIN STEINMAN FIXATION KNEE (PIN) ×3 IMPLANT
PROTECTOR NERVE ULNAR (MISCELLANEOUS) ×3 IMPLANT
SET HNDPC FAN SPRY TIP SCT (DISPOSABLE) ×1 IMPLANT
STRIP CLOSURE SKIN 1/2X4 (GAUZE/BANDAGES/DRESSINGS) ×4 IMPLANT
SUT MNCRL AB 4-0 PS2 18 (SUTURE) ×3 IMPLANT
SUT STRATAFIX 0 PDS 27 VIOLET (SUTURE) ×3
SUT VIC AB 2-0 CT1 27 (SUTURE) ×6
SUT VIC AB 2-0 CT1 TAPERPNT 27 (SUTURE) ×3 IMPLANT
SUTURE STRATFX 0 PDS 27 VIOLET (SUTURE) ×1 IMPLANT
TIBIAL BASE ROT PLAT SZ 3 KNEE (Knees) ×3 IMPLANT
WATER STERILE IRR 1000ML POUR (IV SOLUTION) ×6 IMPLANT
WRAP KNEE MAXI GEL POST OP (GAUZE/BANDAGES/DRESSINGS) ×3 IMPLANT
YANKAUER SUCT BULB TIP 10FT TU (MISCELLANEOUS) ×3 IMPLANT

## 2018-12-15 NOTE — Progress Notes (Signed)
AssistedDr. Hulan Fray with right, ultrasound guided, adductor canal block. Side rails up, monitors on throughout procedure. See vital signs in flow sheet. Tolerated Procedure well.

## 2018-12-15 NOTE — Anesthesia Procedure Notes (Signed)
Anesthesia [facility] Block: Adductor canal block   Pre-Anesthetic Checklist: ,, timeout performed, Correct Patient, Correct Site, Correct Laterality, Correct Procedure, Correct Position, site marked, Risks and benefits discussed,  Surgical consent,  Pre-op evaluation,  At surgeon's request and post-op pain management  Laterality: Right  Prep: Maximum Sterile Barrier Precautions used, chloraprep       Needles:  Injection technique: Single-shot  Needle Type: Echogenic Stimulator Needle     Needle Length: 9cm  Needle Gauge: 22     Additional Needles:   Procedures:,,,, ultrasound used (permanent image in chart),,,,  Narrative:  Start time: 12/15/2018 9:01 AM End time: 12/15/2018 9:11 AM Injection made incrementally with aspirations every 5 mL.  Performed by: Personally  Anesthesiologist: Freddrick March, MD  Additional Notes: Monitors applied. No increased pain on injection. No increased resistance to injection. Injection made in 5cc increments. Good needle visualization. Patient tolerated procedure well.

## 2018-12-15 NOTE — Transfer of Care (Signed)
Immediate Anesthesia Transfer of Care Note  Patient: Debbie Floyd  Procedure(s) Performed: TOTAL KNEE ARTHROPLASTY (Right Knee)  Patient Location: PACU  Anesthesia Type:MAC and Spinal  Level of Consciousness: awake and alert   Airway & Oxygen Therapy: Patient Spontanous Breathing and Patient connected to nasal cannula oxygen  Post-op Assessment: Report given to RN and Post -op Vital signs reviewed and stable  Post vital signs: Reviewed and stable  Last Vitals:  Vitals Value Taken Time  BP 121/70 12/15/18 1030  Temp    Pulse 59 12/15/18 1031  Resp 17 12/15/18 1031  SpO2 99 % 12/15/18 1031  Vitals shown include unvalidated device data.  Last Pain:  Vitals:   12/15/18 0734  TempSrc: Oral  PainSc:       Patients Stated Pain Goal: 3 (19/41/74 0814)  Complications: No apparent anesthesia complications

## 2018-12-15 NOTE — Anesthesia Procedure Notes (Signed)
Spinal  Patient location during procedure: OR Start time: 12/15/2018 9:07 AM End time: 12/15/2018 9:17 AM Staffing Anesthesiologist: Freddrick March, MD Performed: anesthesiologist  Preanesthetic Checklist Completed: patient identified, surgical consent, pre-op evaluation, timeout performed, IV checked, risks and benefits discussed and monitors and equipment checked Spinal Block Patient position: sitting Prep: site prepped and draped and DuraPrep Patient monitoring: cardiac monitor, continuous pulse ox and blood pressure Approach: midline Location: L3-4 Injection technique: single-shot Needle Needle type: Pencan  Needle gauge: 24 G Needle length: 9 cm Assessment Sensory level: T6 Additional Notes Functioning IV was confirmed and monitors were applied. Sterile prep and drape, including hand hygiene and sterile gloves were used. The patient was positioned and the spine was prepped. The skin was anesthetized with lidocaine.  Free flow of clear CSF was obtained prior to injecting local anesthetic into the CSF.  The spinal needle aspirated freely following injection.  The needle was carefully withdrawn.  The patient tolerated the procedure well.

## 2018-12-15 NOTE — Anesthesia Preprocedure Evaluation (Addendum)
Anesthesia Evaluation  Patient identified by MRN, date of birth, ID band Patient awake    Reviewed: Allergy & Precautions, NPO status , Patient's Chart, lab work & pertinent test results, reviewed documented beta blocker date and time   Airway Mallampati: III  TM Distance: >3 FB Neck ROM: Full    Dental no notable dental hx. (+) Teeth Intact, Dental Advisory Given   Pulmonary neg pulmonary ROS,    Pulmonary exam normal breath sounds clear to auscultation       Cardiovascular hypertension, Pt. on home beta blockers and Pt. on medications Normal cardiovascular exam+ dysrhythmias (palpitations on metoprolol)  Rhythm:Regular Rate:Normal  HLD   Neuro/Psych negative neurological ROS  negative psych ROS   GI/Hepatic Neg liver ROS, PUD, GERD  Medicated and Controlled,H/o UC   Endo/Other  negative endocrine ROS  Renal/GU negative Renal ROS  negative genitourinary   Musculoskeletal  (+) Arthritis ,   Abdominal   Peds  Hematology negative hematology ROS (+)   Anesthesia Other Findings   Reproductive/Obstetrics                           Anesthesia Physical Anesthesia Plan  ASA: II  Anesthesia Plan: Spinal and Regional   Post-op Pain Management:  Regional for Post-op pain   Induction:   PONV Risk Score and Plan: 2 and Treatment may vary due to age or medical condition  Airway Management Planned: Natural Airway  Additional Equipment:   Intra-op Plan:   Post-operative Plan:   Informed Consent: I have reviewed the patients History and Physical, chart, labs and discussed the procedure including the risks, benefits and alternatives for the proposed anesthesia with the patient or authorized representative who has indicated his/her understanding and acceptance.     Dental advisory given  Plan Discussed with: CRNA  Anesthesia Plan Comments:         Anesthesia Quick Evaluation

## 2018-12-15 NOTE — Discharge Instructions (Addendum)
Dr. Gaynelle Arabian Total Joint Specialist [facility] [address], [place], [place] [address] [phone]  TOTAL KNEE REPLACEMENT POSTOPERATIVE DIRECTIONS  Knee Rehabilitation, Guidelines Following Surgery  Results after knee surgery are often greatly improved when you follow the exercise, range of motion and muscle strengthening exercises prescribed by your doctor. Safety measures are also important to protect the knee from further injury. Any time any of these exercises cause you to have increased pain or swelling in your knee joint, decrease the amount until you are comfortable again and slowly increase them. If you have problems or questions, call your caregiver or physical therapist for advice.   HOME CARE INSTRUCTIONS  Remove items at home which could result in a fall. This includes throw rugs or furniture in walking pathways.   ICE to the affected knee every three hours for 30 minutes at a time and then as needed for pain and swelling.  Continue to use ice on the knee for pain and swelling from surgery. You may notice swelling that will progress down to the foot and ankle.  This is normal after surgery.  Elevate the leg when you are not up walking on it.    Continue to use the breathing machine which will help keep your temperature down.  It is common for your temperature to cycle up and down following surgery, especially at night when you are not up moving around and exerting yourself.  The breathing machine keeps your lungs expanded and your temperature down.  Do not place pillow under knee, focus on keeping the knee straight while resting  DIET You may resume your previous home diet once your are discharged from the hospital.  DRESSING / WOUND CARE / SHOWERING You may shower 3 days after surgery, but keep the wounds dry during showering.  You may use an occlusive plastic wrap (Press'n Seal for example), NO SOAKING/SUBMERGING IN THE BATHTUB.  If the bandage gets  wet, change with a clean dry gauze.  If the incision gets wet, pat the wound dry with a clean towel. You may start showering once you are discharged home but do not submerge the incision under water. Just pat the incision dry and apply a dry gauze dressing on daily. Change the surgical dressing daily and reapply a dry dressing each time.  ACTIVITY Walk with your walker as instructed. Use walker as long as suggested by your caregivers. Avoid periods of inactivity such as sitting longer than an hour when not asleep. This helps prevent blood clots.  You may resume a sexual relationship in one month or when given the OK by your doctor.  You may return to work once you are cleared by your doctor.  Do not drive a car for 6 weeks or until released by you surgeon.  Do not drive while taking narcotics.  WEIGHT BEARING Weight bearing as tolerated with assist device (walker, cane, etc) as directed, use it as long as suggested by your surgeon or therapist, typically at least 4-6 weeks.  POSTOPERATIVE CONSTIPATION PROTOCOL Constipation - defined medically as fewer than three stools per week and severe constipation as less than one stool per week.  One of the most common issues patients have following surgery is constipation.  Even if you have a regular bowel pattern at home, your normal regimen is likely to be disrupted due to multiple reasons following surgery.  Combination of anesthesia, postoperative narcotics, change in appetite and fluid intake all can affect your bowels.  In order to avoid complications following surgery, here are some recommendations in order to help you during your recovery period.  Colace (docusate) - Pick up an over-the-counter form of Colace or another stool softener and take twice a day as long as you are requiring postoperative pain medications.  Take with a full glass of water daily.  If you experience loose stools or diarrhea, hold the colace until you stool forms back up.  If  your symptoms do not get better within 1 week or if they get worse, check with your doctor.  Dulcolax (bisacodyl) - Pick up over-the-counter and take as directed by the product packaging as needed to assist with the movement of your bowels.  Take with a full glass of water.  Use this product as needed if not relieved by Colace only.   MiraLax (polyethylene glycol) - Pick up over-the-counter to have on hand.  MiraLax is a solution that will increase the amount of water in your bowels to assist with bowel movements.  Take as directed and can mix with a glass of water, juice, soda, coffee, or tea.  Take if you go more than two days without a movement. Do not use MiraLax more than once per day. Call your doctor if you are still constipated or irregular after using this medication for 7 days in a row.  If you continue to have problems with postoperative constipation, please contact the office for further assistance and recommendations.  If you experience "the worst abdominal pain ever" or develop nausea or vomiting, please contact the office immediatly for further recommendations for treatment.  ITCHING  If you experience itching with your medications, try taking only a single pain pill, or even half a pain pill at a time.  You can also use Benadryl over the counter for itching or also to help with sleep.   TED HOSE STOCKINGS Wear the elastic stockings on both legs for three weeks following surgery during the day but you may remove then at night for sleeping.  MEDICATIONS See your medication summary on the After Visit Summary that the nursing staff will review with you prior to discharge.  You may have some home medications which will be placed on hold until you complete the course of blood thinner medication.  It is important for you to complete the blood thinner medication as prescribed by your surgeon.  Continue your approved medications as instructed at time of discharge.  Gabapentin 300 mg  Protocol Take a 300 mg capsule three times a day for two weeks, Then a 300 mg capsule twice a day for two weeks, Then a 300 mg capsule once a day for two weeks, then discontinue the Gabapentin.  PRECAUTIONS If you experience chest pain or shortness of breath - call 911 immediately for transfer to the hospital emergency department.  If you develop a fever greater that 101 F, purulent drainage from wound, increased redness or drainage from wound, foul odor from the wound/dressing, or calf pain - CONTACT YOUR SURGEON.                                                   FOLLOW-UP APPOINTMENTS Make sure you keep all of your appointments after your operation with your surgeon and caregivers. You should call the office at the above phone number and make an appointment for  approximately two weeks after the date of your surgery or on the date instructed by your surgeon outlined in the "After Visit Summary".   RANGE OF MOTION AND STRENGTHENING EXERCISES  Rehabilitation of the knee is important following a knee injury or an operation. After just a few days of immobilization, the muscles of the thigh which control the knee become weakened and shrink (atrophy). Knee exercises are designed to build up the tone and strength of the thigh muscles and to improve knee motion. Often times heat used for twenty to thirty minutes before working out will loosen up your tissues and help with improving the range of motion but do not use heat for the first two weeks following surgery. These exercises can be done on a training (exercise) mat, on the floor, on a table or on a bed. Use what ever works the best and is most comfortable for you Knee exercises include:  Leg Lifts - While your knee is still immobilized in a splint or cast, you can do straight leg raises. Lift the leg to 60 degrees, hold for 3 sec, and slowly lower the leg. Repeat 10-20 times 2-3 times daily. Perform this exercise against resistance later as your knee  gets better.  Quad and Hamstring Sets - Tighten up the muscle on the front of the thigh (Quad) and hold for 5-10 sec. Repeat this 10-20 times hourly. Hamstring sets are done by pushing the foot backward against an object and holding for 5-10 sec. Repeat as with quad sets.   Leg Slides: Lying on your back, slowly slide your foot toward your buttocks, bending your knee up off the floor (only go as far as is comfortable). Then slowly slide your foot back down until your leg is flat on the floor again.  Angel Wings: Lying on your back spread your legs to the side as far apart as you can without causing discomfort.  A rehabilitation program following serious knee injuries can speed recovery and prevent re-injury in the future due to weakened muscles. Contact your doctor or a physical therapist for more information on knee rehabilitation.   IF YOU ARE TRANSFERRED TO A SKILLED REHAB FACILITY If the patient is transferred to a skilled rehab facility following release from the hospital, a list of the current medications will be sent to the facility for the patient to continue.  When discharged from the skilled rehab facility, please have the facility set up the patient's [place] prior to being released. Also, the skilled facility will be responsible for providing the patient with their medications at time of release from the facility to include their pain medication, the muscle relaxants, and their blood thinner medication. If the patient is still at the rehab facility at time of the two week follow up appointment, the skilled rehab facility will also need to assist the patient in arranging follow up appointment in our office and any transportation needs.  MAKE SURE YOU:  Understand these instructions.  Get help right away if you are not doing well or get worse.    Pick up stool softner and laxative for home use following surgery while on pain medications. Do not submerge incision under  water. Please use good hand washing techniques while changing dressing each day. May shower starting three days after surgery. Please use a clean towel to pat the incision dry following showers. Continue to use ice for pain and swelling after surgery. Do not use any lotions or creams on the  incision until instructed by your surgeon.  Information on my medicine - XARELTO (Rivaroxaban)  This medication education was reviewed with me or my healthcare representative as part of my discharge preparation.  The pharmacist that spoke with me during my hospital stay was:    Why was Xarelto prescribed for you? Xarelto was prescribed for you to reduce the risk of blood clots forming after orthopedic surgery. The medical term for these abnormal blood clots is venous thromboembolism (VTE).  What do you need to know about xarelto ? Take your Xarelto ONCE DAILY at the same time every day. You may take it either with or without food.  If you have difficulty swallowing the tablet whole, you may crush it and mix in applesauce just prior to taking your dose.  Take Xarelto exactly as prescribed by your doctor and DO NOT stop taking Xarelto without talking to the doctor who prescribed the medication.  Stopping without other VTE prevention medication to take the place of Xarelto may increase your risk of developing a clot.  After discharge, you should have regular check-up appointments with your healthcare provider that is prescribing your Xarelto.    What do you do if you miss a dose? If you miss a dose, take it as soon as you remember on the same day then continue your regularly scheduled once daily regimen the next day. Do not take two doses of Xarelto on the same day.   Important Safety Information A possible side effect of Xarelto is bleeding. You should call your healthcare provider right away if you experience any of the following: ? Bleeding from an injury or your nose that does not  stop. ? Unusual colored urine (red or dark brown) or unusual colored stools (red or black). ? Unusual bruising for unknown reasons. ? A serious fall or if you hit your head (even if there is no bleeding).  Some medicines may interact with Xarelto and might increase your risk of bleeding while on Xarelto. To help avoid this, consult your healthcare provider or pharmacist prior to using any new prescription or non-prescription medications, including herbals, vitamins, non-steroidal anti-inflammatory drugs (NSAIDs) and supplements.  This website has more information on Xarelto: [email or web address]

## 2018-12-15 NOTE — Evaluation (Signed)
Physical Therapy Evaluation Patient Details Name: Debbie Floyd MRN: [id number] DOB: [birth year] Today's Date: 12/15/2018   History of Present Illness  Pt is 63 y.o. female s/p Rt TKA on 12/15/18 with PH significant for HTN, GERD, and breast cancer.  Clinical Impression  Debbie Floyd is a 63 y.o. female POD 0 s/p Rt TKA. Patient reports independence with mobility at baseline. Evaluation was limited by symptomatic hypotension while sitting EOB and transfers/gait were not tested. Patient is now limited by functional impairments (see PT problem list below) and required min assist for ed mobility this date. Patient instructed in exercise to facilitate ROM and circulation to manage edema. Patient will benefit from continued skilled PT interventions to address impairments and progress towards PLOF. Acute PT will follow to progress mobility and stair training in preparation for safe discharge home.     Follow Up Recommendations Follow surgeon's recommendation for DC plan and follow-up therapies    Equipment Recommendations  None recommended by PT    Recommendations for Other Services       Precautions / Restrictions Precautions Precautions: Fall Restrictions Weight Bearing Restrictions: No RLE Weight Bearing: Weight bearing as tolerated      Mobility  Bed Mobility Overal bed mobility: Needs Assistance Bed Mobility: Supine to Sit;Sit to Supine     Supine to sit: Min assist;HOB elevated Sit to supine: Min assist;HOB elevated   General bed mobility comments: cues for seqeuncing with bed rails, assist for LE mobility. pt limited to dangle EOB due to nausea and symptomatic hypotension. After ~ 5 minutes sitting EOB pt BP at 88/65 (HR = 77). Upon returning to supine pt's pressure improved to 108/68 (HR = 61). SYmptoms subsided after ~ 3 minutes supine.  Transfers          General transfer comment: NT due to hypotension  Ambulation/Gait      General Gait Details: NT due to  hypotension  Stairs            Wheelchair Mobility    Modified Rankin (Stroke Patients Only)       Balance Overall balance assessment: Needs assistance Sitting-balance support: Bilateral upper extremity supported;Feet supported Sitting balance-Leahy Scale: Fair         Standing balance comment: NT due to hypotension              Pertinent Vitals/Pain Pain Assessment: Faces Faces Pain Scale: Hurts little more Pain Location: Rt knee Pain Descriptors / Indicators: Sore;Aching;Grimacing Pain Intervention(s): Monitored during session;Limited activity within patient's tolerance;Repositioned;Ice applied    Home Living Family/patient expects to be discharged to:: Private residence Living Arrangements: Spouse/significant other Available Help at Discharge: Family Type of Home: House Home Access: Stairs to enter   [employer] of Steps: 1 step and then the threshold, no rails Home Layout: One level Home Equipment: [place] - 2 wheels;Cane - single point      Prior Function Level of Independence: Independent         Hand Dominance        Extremity/Trunk Assessment   Upper Extremity Assessment Upper Extremity Assessment: Overall WFL for tasks assessed    Lower Extremity Assessment Lower Extremity Assessment: RLE deficits/detail RLE Deficits / Details: pt with good quad activation in supine and no extensor lag with SLR, 4/5 wtih leg extension test, light touch appears intact    Cervical / Trunk Assessment Cervical / Trunk Assessment: Normal  Communication   Communication: No difficulties  Cognition Arousal/Alertness: Awake/alert Behavior During Therapy: WFL for  tasks assessed/performed Overall Cognitive Status: Within Functional Limits for tasks assessed             General Comments      Exercises Total Joint Exercises Quad Sets: AROM;Supine;Right;10 reps Heel Slides: AROM;5 reps;Supine;Right   Assessment/Plan    PT Assessment  Patient needs continued PT services  PT Problem List Decreased strength;Decreased balance;Decreased mobility;Decreased range of motion;Decreased coordination;Decreased activity tolerance;Decreased knowledge of use of DME       PT Treatment Interventions DME instruction;Functional mobility training;Balance training;Patient/family education;Modalities;Therapeutic activities;Gait training;Stair training;Therapeutic exercise    PT Goals (Current goals can be found in the Care Plan section)  Acute Rehab PT Goals Patient Stated Goal: regain independence with walking PT Goal Formulation: With patient Time For Goal Achievement: 12/22/18 Potential to Achieve Goals: Good    Frequency 7X/week    AM-PAC PT "6 Clicks" Mobility  Outcome Measure Help needed turning from your back to your side while in a flat bed without using bedrails?: A Little Help needed moving from lying on your back to sitting on the side of a flat bed without using bedrails?: A Little Help needed moving to and from a bed to a chair (including a wheelchair)?: A Little Help needed standing up from a chair using your arms (e.g., wheelchair or bedside chair)?: A Little Help needed to walk in hospital room?: A Little Help needed climbing 3-5 steps with a railing? : A Little 6 Click Score: 18    End of Session Equipment Utilized During Treatment: Gait belt Activity Tolerance: Patient tolerated treatment well Patient left: in bed;with call bell/phone within reach;with bed alarm set;with family/visitor present   PT Visit Diagnosis: Other abnormalities of gait and mobility (R26.89);Muscle weakness (generalized) (M62.81);Difficulty in walking, not elsewhere classified (R26.2)    Time: 0350-0938 PT Time Calculation (min) (ACUTE ONLY): 29 min   Charges:   PT Evaluation $PT Eval Moderate Complexity: 1 Mod         Kipp Brood, PT, DPT Physical Therapist with [facility]  12/15/2018 6:26  PM

## 2018-12-15 NOTE — Interval H&P Note (Signed)
History and Physical Interval Note:  12/15/2018 7:04 AM  Debbie Floyd  has presented today for surgery, with the diagnosis of Right knee osteoarthritis.  The various methods of treatment have been discussed with the patient and family. After consideration of risks, benefits and other options for treatment, the patient has consented to  Procedure(s) with comments: TOTAL KNEE ARTHROPLASTY (Right) - 83mn as a surgical intervention.  The patient's history has been reviewed, patient examined, no change in status, stable for surgery.  I have reviewed the patient's chart and labs.  Questions were answered to the patient's satisfaction.     FPilar PlateAluisio

## 2018-12-15 NOTE — Anesthesia Postprocedure Evaluation (Signed)
Anesthesia Post Note  Patient: Tyronda Hortencia Pilar  Procedure(s) Performed: TOTAL KNEE ARTHROPLASTY (Right Knee)     Patient location during evaluation: PACU Anesthesia Type: Regional and Spinal Level of consciousness: oriented and awake and alert Pain management: pain level controlled Vital Signs Assessment: post-procedure vital signs reviewed and stable Respiratory status: spontaneous breathing, respiratory function stable and patient connected to nasal cannula oxygen Cardiovascular status: blood pressure returned to baseline and stable Postop Assessment: no headache, no backache and no apparent nausea or vomiting Anesthetic complications: no    Last Vitals:  Vitals:   12/15/18 1353 12/15/18 1505  BP: (!) 144/72 (!) 143/86  Pulse: (!) 53 (!) 59  Resp: 16 16  Temp: 36.4 C 36.7 C  SpO2: 100% 100%    Last Pain:  Vitals:   12/15/18 1505  TempSrc: Oral  PainSc:                  Nehan Flaum L Daneya Hartgrove

## 2018-12-15 NOTE — Progress Notes (Signed)
Aluisio, MD was paged regarding the pt's sitting BP of 88/65, pulse 77. MD gave verbal orders for a 500 ml bolus of NS. I will continue to monitor.

## 2018-12-15 NOTE — Op Note (Signed)
OPERATIVE REPORT-TOTAL KNEE ARTHROPLASTY   Pre-operative diagnosis- Osteoarthritis  Right knee(s)  Post-operative diagnosis- Osteoarthritis Right knee(s)  Procedure-  Right  Total Knee Arthroplasty  Surgeon- [name]. Sweet Jarvis, MD  Assistant- Ardeen Jourdain, PA-C   Anesthesia-  Adductor canal block and spinal  EBL-50 mL   Drains Hemovac  Tourniquet time-  Total Tourniquet Time Documented: Thigh (Right) - 31 minutes Total: Thigh (Right) - 31 minutes     Complications- None  Condition-PACU - hemodynamically stable.   Brief Clinical Note  Debbie Floyd is a 63 y.o. year old female with end stage OA of her right knee with progressively worsening pain and dysfunction. She has constant pain, with activity and at rest and significant functional deficits with difficulties even with ADLs. She has had extensive non-op management including analgesics, injections of cortisone and viscosupplements, and home exercise program, but remains in significant pain with significant dysfunction.Radiographs show bone on bone arthritis medial and patellofemoral. She presents now for right Total Knee Arthroplasty.    Procedure in detail---   The patient is brought into the operating room and positioned supine on the operating table. After successful administration of  Adductor canal block and spinal,   a tourniquet is placed high on the  Right thigh(s) and the lower extremity is prepped and draped in the usual sterile fashion. Time out is performed by the operating team and then the  Right lower extremity is wrapped in Esmarch, knee flexed and the tourniquet inflated to 300 mmHg.       A midline incision is made with a ten blade through the subcutaneous tissue to the level of the extensor mechanism. A fresh blade is used to make a medial parapatellar arthrotomy. Soft tissue over the proximal medial tibia is subperiosteally elevated to the joint line with a knife and into the semimembranosus bursa with a  Cobb elevator. Soft tissue over the proximal lateral tibia is elevated with attention being paid to avoiding the patellar tendon on the tibial tubercle. The patella is everted, knee flexed 90 degrees and the ACL and PCL are removed. Findings are bone on bone medial and patellofemoral with large global osteophytes.        The drill is used to create a starting hole in the distal femur and the canal is thoroughly irrigated with sterile saline to remove the fatty contents. The 5 degree Right  valgus alignment guide is placed into the femoral canal and the distal femoral cutting block is pinned to remove 9 mm off the distal femur. Resection is made with an oscillating saw.      The tibia is subluxed forward and the menisci are removed. The extramedullary alignment guide is placed referencing proximally at the medial aspect of the tibial tubercle and distally along the second metatarsal axis and tibial crest. The block is pinned to remove 35m off the more deficient medial  side. Resection is made with an oscillating saw. Size 3is the most appropriate size for the tibia and the proximal tibia is prepared with the modular drill and keel punch for that size.      The femoral sizing guide is placed and size 4 is most appropriate. Rotation is marked off the epicondylar axis and confirmed by creating a rectangular flexion gap at 90 degrees. The size 4 cutting block is pinned in this rotation and the anterior, posterior and chamfer cuts are made with the oscillating saw. The intercondylar block is then placed and that cut is made.  Trial size 3 tibial component, trial size 4 narrow posterior stabilized femur and a 7  mm posterior stabilized rotating platform insert trial is placed. Full extension is achieved with excellent varus/valgus and anterior/posterior balance throughout full range of motion. The patella is everted and thickness measured to be 21  mm. Free hand resection is taken to 12 mm, a 32 template is placed,  lug holes are drilled, trial patella is placed, and it tracks normally. Osteophytes are removed off the posterior femur with the trial in place. All trials are removed and the cut bone surfaces prepared with pulsatile lavage. Cement is mixed and once ready for implantation, the size 3 tibial implant, size  4 narrow posterior stabilized femoral component, and the size 32 patella are cemented in place and the patella is held with the clamp. The trial insert is placed and the knee held in full extension. The Exparel (20 ml mixed with 60 ml saline) is injected into the extensor mechanism, posterior capsule, medial and lateral gutters and subcutaneous tissues.  All extruded cement is removed and once the cement is hard the permanent 7 mm posterior stabilized rotating platform insert is placed into the tibial tray.      The wound is copiously irrigated with saline solution and the extensor mechanism closed over a hemovac drain with #1 V-loc suture. The tourniquet is released for a total tourniquet time of 31  minutes. Flexion against gravity is 140 degrees and the patella tracks normally. Subcutaneous tissue is closed with 2.0 vicryl and subcuticular with running 4.0 Monocryl. The incision is cleaned and dried and steri-strips and a bulky sterile dressing are applied. The limb is placed into a knee immobilizer and the patient is awakened and transported to recovery in stable condition.      Please note that a surgical assistant was a medical necessity for this procedure in order to perform it in a safe and expeditious manner. Surgical assistant was necessary to retract the ligaments and vital neurovascular structures to prevent injury to them and also necessary for proper positioning of the limb to allow for anatomic placement of the prosthesis.   [name] [name], MD    12/15/2018, 10:16 AM

## 2018-12-16 ENCOUNTER — Encounter (HOSPITAL_COMMUNITY): Payer: Self-pay | Admitting: Orthopedic Surgery

## 2018-12-16 LAB — CBC
HCT: 37 % (ref 36.0–46.0)
Hemoglobin: 11.9 g/dL — ABNORMAL LOW (ref 12.0–15.0)
MCH: 32.1 pg (ref 26.0–34.0)
MCHC: 32.2 g/dL (ref 30.0–36.0)
MCV: 99.7 fL (ref 80.0–100.0)
Platelets: 281 10*3/uL (ref 150–400)
RBC: 3.71 MIL/uL — ABNORMAL LOW (ref 3.87–5.11)
RDW: 13 % (ref 11.5–15.5)
WBC: 8.7 10*3/uL (ref 4.0–10.5)
nRBC: 0 % (ref 0.0–0.2)

## 2018-12-16 LAB — BASIC METABOLIC PANEL
Anion gap: 8 (ref 5–15)
BUN: 11 mg/dL (ref 8–23)
CO2: 22 mmol/L (ref 22–32)
Calcium: 8 mg/dL — ABNORMAL LOW (ref 8.9–10.3)
Chloride: 106 mmol/L (ref 98–111)
Creatinine, Ser: 0.64 mg/dL (ref 0.44–1.00)
GFR calc Af Amer: >60 mL/min (ref >60)
GFR calc non Af Amer: >60 mL/min (ref >60)
Glucose, Bld: 157 mg/dL — ABNORMAL HIGH (ref 70–99)
Potassium: 4 mmol/L (ref 3.5–5.1)
Sodium: 136 mmol/L (ref 135–145)

## 2018-12-16 MED ORDER — RIVAROXABAN 10 MG PO TABS: 10.0000 mg | ORAL_TABLET | Freq: Every day | ORAL | 0 refills | Status: DC

## 2018-12-16 MED ORDER — HYDROMORPHONE HCL 2 MG PO TABS: 2.0000 mg | ORAL_TABLET | Freq: Four times a day (QID) | ORAL | 0 refills | Status: DC | PRN

## 2018-12-16 MED ORDER — METHOCARBAMOL 500 MG PO TABS: 500.0000 mg | ORAL_TABLET | Freq: Four times a day (QID) | ORAL | 0 refills | Status: DC | PRN

## 2018-12-16 MED ORDER — SODIUM CHLORIDE 0.9 % IV BOLUS
500.0000 mL | Freq: Once | INTRAVENOUS | Status: AC
  Administered 2018-12-16: 500 mL via INTRAVENOUS

## 2018-12-16 MED ORDER — GABAPENTIN 300 MG PO CAPS: 300.0000 mg | ORAL_CAPSULE | Freq: Three times a day (TID) | ORAL | 0 refills | Status: DC

## 2018-12-16 NOTE — Progress Notes (Signed)
Physical Therapy Treatment Patient Details Name: Debbie Floyd MRN: [id number] DOB: 21-Jul-1955 Today's Date: 12/16/2018    History of Present Illness Pt is 63 y.o. female s/p Rt TKA on 12/15/18 with PH significant for HTN, GERD, and breast cancer.    PT Comments    POD # 1 am session General bed mobility comments: demonstarted and instructed how to use a belt to self assist LE off bed.  General transfer comment: 25%VC's on proper hand placement and safety with turns. Also assisted with toilet transfer.General Gait Details: tolerated distance well. No c/o dizziness.  25% VC's on proper walker to self distance and upright posture.  Then returned to room to perform some TE's following HEP handout.  Instructed on proper tech, freq as well as use of ICE.   Pt will need another PT session to address one step and amb again prior to D/C.   Follow Up Recommendations  Follow surgeon's recommendation for DC plan and follow-up therapies;Outpatient PT     Equipment Recommendations  None recommended by PT(has walker from prior surgery)    Recommendations for Other Services       Precautions / Restrictions Precautions Precautions: Fall Precaution Comments: reviewed no pillow under knee Restrictions Weight Bearing Restrictions: No RLE Weight Bearing: Weight bearing as tolerated    Mobility  Bed Mobility Overal bed mobility: Needs Assistance Bed Mobility: Supine to Sit     Supine to sit: Min guard;Supervision     General bed mobility comments: demonstarted and instructed how to use a belt to self assist LE off bed  Transfers Overall transfer level: Needs assistance Equipment used: Rolling walker (2 wheeled) Transfers: Sit to/from [facility] Sit to Stand: Min guard;Min assist Stand pivot transfers: Min assist       General transfer comment: 25%VC's on proper hand placement and safety with turns. Also assisted with toilet  transfer.  Ambulation/Gait Ambulation/Gait assistance: Min guard;Supervision Gait Distance (Feet): 45 Feet Assistive device: Rolling walker (2 wheeled) Gait Pattern/deviations: Step-to pattern;Decreased stance time - right Gait velocity: decreased   General Gait Details: tolerated distance well. No c/o dizziness.  25% VC's on proper walker to self distance and upright posture.   Stairs             Wheelchair Mobility    Modified Rankin (Stroke Patients Only)       Balance                                            Cognition Arousal/Alertness: Awake/alert Behavior During Therapy: WFL for tasks assessed/performed Overall Cognitive Status: Within Functional Limits for tasks assessed                                        Exercises   Total Knee Replacement TE's 10 reps B LE ankle pumps 10 reps towel squeezes 10 reps knee presses 10 reps heel slides  10 reps SAQ's 10 reps SLR's 10 reps ABD Followed by ICE     General Comments        Pertinent Vitals/Pain Pain Assessment: 0-10 Pain Score: 5  Pain Location: Rt knee Pain Descriptors / Indicators: Sore;Grimacing Pain Intervention(s): Monitored during session;Repositioned;Ice applied;Premedicated before session    Home Living  Prior Function            PT Goals (current goals can now be found in the care plan section) Progress towards PT goals: Progressing toward goals    Frequency    7X/week      PT Plan Current plan remains appropriate    Co-evaluation              AM-PAC PT "6 Clicks" Mobility   Outcome Measure  Help needed turning from your back to your side while in a flat bed without using bedrails?: A Little Help needed moving from lying on your back to sitting on the side of a flat bed without using bedrails?: A Little Help needed moving to and from a bed to a chair (including a wheelchair)?: A Little Help needed  standing up from a chair using your arms (e.g., wheelchair or bedside chair)?: A Little Help needed to walk in hospital room?: A Little Help needed climbing 3-5 steps with a railing? : A Little 6 Click Score: 18    End of Session Equipment Utilized During Treatment: Gait belt Activity Tolerance: Patient tolerated treatment well Patient left: with call bell/phone within reach;with bed alarm set;with family/visitor present;in chair   PT Visit Diagnosis: Other abnormalities of gait and mobility (R26.89);Muscle weakness (generalized) (M62.81);Difficulty in walking, not elsewhere classified (R26.2)     Time: 1000-1040 PT Time Calculation (min) (ACUTE ONLY): 40 min  Charges:  $Gait Training: 8-22 mins $Therapeutic Exercise: 8-22 mins $Therapeutic Activity: 8-22 mins                     Rica Koyanagi  PTA Acute  Rehabilitation Services Pager      [phone] Office      [phone]

## 2018-12-16 NOTE — Progress Notes (Signed)
Physical Therapy Treatment Patient Details Name: Debbie Floyd MRN: [id number] DOB: April 07, 1955 Today's Date: 12/16/2018    History of Present Illness Pt is 63 y.o. female s/p Rt TKA on 12/15/18 with PH significant for HTN, GERD, and breast cancer.    PT Comments    POD # 1 pm session Assisted with amb a greater distance in hallway.  Practiced one step. General stair comments: 25% VC's on proper walker placement, proper sequencing and safety.  Spouse present and performed "hands on" instruction. Addressed all mobility questions, discussed appropriate activity, educated on use of ICE.  Pt ready for D/C to home.    Follow Up Recommendations  Follow surgeon's recommendation for DC plan and follow-up therapies;Outpatient PT     Equipment Recommendations  None recommended by PT(has walker from prior surgery)    Recommendations for Other Services       Precautions / Restrictions Precautions Precautions: Fall Precaution Comments: reviewed no pillow under knee Restrictions Weight Bearing Restrictions: No RLE Weight Bearing: Weight bearing as tolerated    Mobility  Bed Mobility Overal bed mobility: Needs Assistance Bed Mobility: Supine to Sit     Supine to sit: Min guard;Supervision     General bed mobility comments: OOB in recliner  Transfers Overall transfer level: Needs assistance Equipment used: Rolling walker (2 wheeled) Transfers: Sit to/from [facility] Sit to Stand: Min guard;Min assist Stand pivot transfers: Min assist       General transfer comment: 25%VC's on proper hand placement and safety with turns. Also assisted with toilet transfer.  Ambulation/Gait Ambulation/Gait assistance: Min guard;Supervision Gait Distance (Feet): 75 Feet Assistive device: Rolling walker (2 wheeled) Gait Pattern/deviations: Step-to pattern;Decreased stance time - right Gait velocity: decreased   General Gait Details: tolerated distance well. No c/o dizziness.   25% VC's on proper walker to self distance and upright posture.   Stairs Stairs: Yes Stairs assistance: Supervision;Min guard Stair Management: No rails;Step to pattern;With walker Number of Stairs: 1 General stair comments: 25% VC's on proper walker placement, proper sequencing and safety.  Spouse present and performed "hands on" instruction.   Wheelchair Mobility    Modified Rankin (Stroke Patients Only)       Balance                                            Cognition Arousal/Alertness: Awake/alert Behavior During Therapy: WFL for tasks assessed/performed Overall Cognitive Status: Within Functional Limits for tasks assessed                                        Exercises      General Comments        Pertinent Vitals/Pain Pain Assessment: 0-10 Pain Score: 5  Pain Location: Rt knee Pain Descriptors / Indicators: Sore;Grimacing Pain Intervention(s): Monitored during session;Repositioned;Ice applied;Premedicated before session    Home Living                      Prior Function            PT Goals (current goals can now be found in the care plan section) Progress towards PT goals: Progressing toward goals    Frequency    7X/week      PT Plan Current plan remains appropriate  Co-evaluation              AM-PAC PT "6 Clicks" Mobility   Outcome Measure  Help needed turning from your back to your side while in a flat bed without using bedrails?: A Little Help needed moving from lying on your back to sitting on the side of a flat bed without using bedrails?: A Little Help needed moving to and from a bed to a chair (including a wheelchair)?: A Little Help needed standing up from a chair using your arms (e.g., wheelchair or bedside chair)?: A Little Help needed to walk in hospital room?: A Little Help needed climbing 3-5 steps with a railing? : A Little 6 Click Score: 18    End of Session Equipment  Utilized During Treatment: Gait belt Activity Tolerance: Patient tolerated treatment well Patient left: with call bell/phone within reach;with bed alarm set;with family/visitor present;in chair   PT Visit Diagnosis: Other abnormalities of gait and mobility (R26.89);Muscle weakness (generalized) (M62.81);Difficulty in walking, not elsewhere classified (R26.2)     Time: 1350-1415 PT Time Calculation (min) (ACUTE ONLY): 25 min  Charges:  $Gait Training: 8-22 mins $Therapeutic Activity: 8-22 mins                     Rica Koyanagi  PTA Acute  Rehabilitation Services Pager      [phone] Office      [phone]

## 2018-12-16 NOTE — Progress Notes (Signed)
Subjective: 1 Day Post-Op Procedure(s) (LRB): TOTAL KNEE ARTHROPLASTY (Right) Patient reports pain as mild.   Patient seen in rounds by Dr. Wynelle Link. Patient is well, and has had no acute complaints or problems other than a few episodes of symptomatic hypotension with lightheadedness yesterday. She was given a bolus yesterday, and this morning as well. No acute events overnight. Foley catheter removed, positive flatus. Denies CP, SHOB.  We will start therapy today.   Objective: Vital signs in last 24 hours: Temp:  [97.4 F (36.3 C)-98.8 F (37.1 C)] 97.4 F (36.3 C) (10/13 0524) Pulse Rate:  [48-79] 62 (10/13 0524) Resp:  [13-20] 17 (10/13 0524) BP: (94-179)/(52-86) 94/57 (10/13 0524) SpO2:  [97 %-100 %] 100 % (10/13 0524) Weight:  [74.6 kg] 74.6 kg (10/12 0720)  Intake/Output from previous day:  Intake/Output Summary (Last 24 hours) at 12/16/2018 0701 Last data filed at 12/16/2018 0630 Gross per 24 hour  Intake 3070.12 ml  Output 1900 ml  Net 1170.12 ml     Intake/Output this shift: No intake/output data recorded.  Labs: Recent Labs    12/16/18 0332  HGB 11.9*   Recent Labs    12/16/18 0332  WBC 8.7  RBC 3.71*  HCT 37.0  PLT 281   Recent Labs    12/16/18 0332  NA 136  K 4.0  CL 106  CO2 22  BUN 11  CREATININE 0.64  GLUCOSE 157*  CALCIUM 8.0*   No results for input(s): LABPT, INR in the last 72 hours.  Exam: General - Patient is Alert and Oriented Extremity - Neurologically intact Sensation intact distally Intact pulses distally Dorsiflexion/Plantar flexion intact Dressing - dressing C/D/I Motor Function - intact, moving foot and toes well on exam.   Past Medical History:  Diagnosis Date  . Arthritis    RIGHT KNEE  . Breast cancer ([place]) 2014   lumpectomy lt  . Cancer of left breast ([place]) 09/14/2014  . Dysrhythmia    PALPITATIONS-WELL CONTROLLED ON METOPROLOL (07-19-15)  . GERD (gastroesophageal reflux disease)   . History of  palpitations   . Hypercholesterolemia   . Hypertension   . Overactive bladder   . Personal history of radiation therapy   . Rosacea   . Ulcerative colitis ([place])     Assessment/Plan: 1 Day Post-Op Procedure(s) (LRB): TOTAL KNEE ARTHROPLASTY (Right) Active Problems:   OA (osteoarthritis) of knee  Estimated body mass index is 27.37 kg/m as calculated from the following:   Height as of this encounter: 5' 5"  (1.651 m).   Weight as of this encounter: 74.6 kg. Advance diet Up with therapy D/C IV fluids   Patient's anticipated LOS is less than 2 midnights, meeting these requirements: - Younger than 85 - Lives within 1 hour of care - Has a competent adult at home to recover with post-op recover - NO history of  - Chronic pain requiring opiods  - Diabetes  - Coronary Artery Disease  - Heart failure  - Heart attack  - Stroke  - DVT/VTE  - Cardiac arrhythmia  - Respiratory Failure/COPD  - Renal failure  - Anemia  - Advanced Liver disease  DVT Prophylaxis - Xarelto Weight bearing as tolerated. D/C O2 and pulse ox and try on room air. Hemovac pulled without difficulty, will begin therapy today.  Plan is to go Home after hospital stay. Plan for discharge home today after 1-2 sessions of therapy if her lightheadedness resolves and she is able to progress appropriately with therapy. Scheduled for OPPT  at [facility] in [place]. Follow up in the office in 2 weeks.   Griffith Citron, PA-C Orthopedic Surgery [phone] 12/16/2018, 7:01 AM

## 2018-12-17 NOTE — Discharge Summary (Signed)
Physician Discharge Summary   Patient ID: Debbie Floyd MRN: [id number] DOB/AGE: August 19, 1955 63 y.o.  Admit date: 12/15/2018 Discharge date: 12/16/2018  Primary Diagnosis: Osteoarthritis right knee   Admission Diagnoses:  Past Medical History:  Diagnosis Date   Arthritis    RIGHT KNEE   Breast cancer ([place]) 2014   lumpectomy lt   Cancer of left breast ([place]) 09/14/2014   Dysrhythmia    PALPITATIONS-WELL CONTROLLED ON METOPROLOL (07-19-15)   GERD (gastroesophageal reflux disease)    History of palpitations    Hypercholesterolemia    Hypertension    Overactive bladder    Personal history of radiation therapy    Rosacea    Ulcerative colitis ([place])    Discharge Diagnoses:   Active Problems:   OA (osteoarthritis) of knee  Estimated body mass index is 27.37 kg/m as calculated from the following:   Height as of this encounter: 5' 5"  (1.651 m).   Weight as of this encounter: 74.6 kg.  Procedure:  Procedure(s) (LRB): TOTAL KNEE ARTHROPLASTY (Right)   Consults: None  HPI: Debbie Floyd is a 63 y.o. year old female with end stage OA of her right knee with progressively worsening pain and dysfunction. She has constant pain, with activity and at rest and significant functional deficits with difficulties even with ADLs. She has had extensive non-op management including analgesics, injections of cortisone and viscosupplements, and home exercise program, but remains in significant pain with significant dysfunction.Radiographs show bone on bone arthritis medial and patellofemoral. She presents now for right Total Knee Arthroplasty.    Laboratory Data: Admission on 12/15/2018, Discharged on 12/16/2018  Component Date Value Ref Range Status   WBC 12/16/2018 8.7  4.0 - 10.5 K/uL Final   RBC 12/16/2018 3.71* 3.87 - 5.11 MIL/uL Final   Hemoglobin 12/16/2018 11.9* 12.0 - 15.0 g/dL Final   HCT 12/16/2018 37.0  36.0 - 46.0 % Final   MCV 12/16/2018 99.7  80.0 - 100.0 fL Final    MCH 12/16/2018 32.1  26.0 - 34.0 pg Final   MCHC 12/16/2018 32.2  30.0 - 36.0 g/dL Final   RDW 12/16/2018 13.0  11.5 - 15.5 % Final   Platelets 12/16/2018 281  150 - 400 K/uL Final   nRBC 12/16/2018 0.0  0.0 - 0.2 % Final   Performed at [facility], [place] [address], [place], [place] [address]   Sodium 12/16/2018 136  135 - 145 mmol/L Final   Potassium 12/16/2018 4.0  3.5 - 5.1 mmol/L Final   Chloride 12/16/2018 106  98 - 111 mmol/L Final   CO2 12/16/2018 22  22 - 32 mmol/L Final   Glucose, Bld 12/16/2018 157* 70 - 99 mg/dL Final   BUN 12/16/2018 11  8 - 23 mg/dL Final   Creatinine, Ser 12/16/2018 0.64  0.44 - 1.00 mg/dL Final   Calcium 12/16/2018 8.0* 8.9 - 10.3 mg/dL Final   GFR calc non Af Amer 12/16/2018 >60  >60 mL/min Final   GFR calc Af Amer 12/16/2018 >60  >60 mL/min Final   Anion gap 12/16/2018 8  5 - 15 Final   Performed at [facility], [place] [address], [place], [place] [address]  Hospital Outpatient Visit on 12/11/2018  Component Date Value Ref Range Status   SARS Coronavirus 2 12/11/2018 NEGATIVE  NEGATIVE Final   Comment: (NOTE) SARS-CoV-2 target nucleic acids are NOT DETECTED. The SARS-CoV-2 RNA is generally detectable in upper and lower respiratory specimens during the acute phase of infection. Negative  results do not preclude SARS-CoV-2 infection, do not rule out co-infections with other pathogens, and should not be used as the sole basis for treatment or other patient management decisions. Negative results must be combined with clinical observations, patient history, and epidemiological information. The expected result is Negative. Fact Sheet for Patients: SugarRoll.be Fact Sheet for Healthcare Providers: https://www.woods-mathews.com/ This test is not yet approved or cleared by the [place] FDA and  has been authorized for detection and/or diagnosis of SARS-CoV-2  by FDA under an Emergency Use Authorization (EUA). This EUA will remain  in effect (meaning this test can be used) for the duration of the COVID-19 declaration under Section 56                          4(b)(1) of the Act, 21 U.S.C. section 360bbb-3(b)(1), unless the authorization is terminated or revoked sooner. Performed at [facility], [place] [address], [place], [place] [address]   Hospital Outpatient Visit on 12/10/2018  Component Date Value Ref Range Status   aPTT 12/10/2018 30  24 - 36 seconds Final   Performed at [facility], [place] [address], [place], [place] [address]   WBC 12/10/2018 6.3  4.0 - 10.5 K/uL Final   RBC 12/10/2018 4.68  3.87 - 5.11 MIL/uL Final   Hemoglobin 12/10/2018 14.8  12.0 - 15.0 g/dL Final   HCT 12/10/2018 45.5  36.0 - 46.0 % Final   MCV 12/10/2018 97.2  80.0 - 100.0 fL Final   MCH 12/10/2018 31.6  26.0 - 34.0 pg Final   MCHC 12/10/2018 32.5  30.0 - 36.0 g/dL Final   RDW 12/10/2018 13.2  11.5 - 15.5 % Final   Platelets 12/10/2018 293  150 - 400 K/uL Final   nRBC 12/10/2018 0.0  0.0 - 0.2 % Final   Performed at [facility], [place] [address], [place], [place] [address]   Sodium 12/10/2018 142  135 - 145 mmol/L Final   Potassium 12/10/2018 3.5  3.5 - 5.1 mmol/L Final   Chloride 12/10/2018 109  98 - 111 mmol/L Final   CO2 12/10/2018 25  22 - 32 mmol/L Final   Glucose, Bld 12/10/2018 107* 70 - 99 mg/dL Final   BUN 12/10/2018 17  8 - 23 mg/dL Final   Creatinine, Ser 12/10/2018 0.83  0.44 - 1.00 mg/dL Final   Calcium 12/10/2018 8.7* 8.9 - 10.3 mg/dL Final   Total Protein 12/10/2018 7.1  6.5 - 8.1 g/dL Final   Albumin 12/10/2018 4.0  3.5 - 5.0 g/dL Final   AST 12/10/2018 19  15 - 41 U/L Final   ALT 12/10/2018 20  0 - 44 U/L Final   Alkaline Phosphatase 12/10/2018 62  38 - 126 U/L Final   Total Bilirubin 12/10/2018 0.8  0.3 - 1.2 mg/dL Final   GFR calc non Af Amer 12/10/2018 >60  >60 mL/min  Final   GFR calc Af Amer 12/10/2018 >60  >60 mL/min Final   Anion gap 12/10/2018 8  5 - 15 Final   Performed at [facility], [place] [address] [place], [place] [address]   Prothrombin Time 12/10/2018 12.8  11.4 - 15.2 seconds Final   INR 12/10/2018 1.0  0.8 - 1.2 Final   Comment: (NOTE) INR goal varies based on device and disease states. Performed at [facility], [place] [address] [place], [place] [address]    ABO/RH(D) 12/10/2018 A POS  Final   Antibody Screen 12/10/2018 NEG   Final   Sample Expiration 12/10/2018 12/18/2018,2359   Final   Extend sample reason 12/10/2018    Final                   Value:NO TRANSFUSIONS OR PREGNANCY IN THE PAST 3 MONTHS Performed at [place] [address] [place], [place] [address]    MRSA, PCR 12/10/2018 NEGATIVE  NEGATIVE Final   Staphylococcus aureus 12/10/2018 POSITIVE* NEGATIVE Final   Comment: (NOTE) The Xpert SA Assay (FDA approved for NASAL specimens in patients 11 years of age and older), is one component of a comprehensive surveillance program. It is not intended to diagnose infection nor to guide or monitor treatment. Performed at [facility], [place] [address], [place], [place] [address]      X-Rays:No results found.  EKG: Orders placed or performed during the hospital encounter of 04/22/15   EKG 12-Lead   EKG 12-Lead   EKG     Hospital Course: Debbie Floyd is a 63 y.o. who was admitted to [facility]. They were brought to the operating room on 12/15/2018 and underwent Procedure(s): TOTAL KNEE ARTHROPLASTY.  Patient tolerated the procedure well and was later transferred to the recovery room and then to the orthopaedic floor for postoperative care. They were given PO and IV analgesics for pain control following their surgery. They were given 24 hours of postoperative antibiotics of  Anti-infectives (From admission, onward)   Start      Dose/Rate Route Frequency Ordered Stop   12/15/18 1530  ceFAZolin (ANCEF) IVPB 1 g/50 mL premix     1 g 100 mL/hr over 30 Minutes Intravenous Every 6 hours 12/15/18 1136 12/16/18 0700   12/15/18 0715  vancomycin (VANCOCIN) IVPB 1000 mg/200 mL premix     1,000 mg 200 mL/hr over 60 Minutes Intravenous On call to O.R. 12/15/18 1448 12/15/18 1012     and started on DVT prophylaxis in the form of Xarelto.   PT and OT were ordered for total joint protocol. Discharge planning consulted to help with postop disposition and equipment needs.  Patient had a good night on the evening of surgery. They started to get up OOB with therapy on POD #1. Patient had a few episodes of symptomatic hypotension with therapy, which improved with normal saline bolus. Pt was seen during rounds and was ready to go home pending progress with therapy. Hemovac drain was pulled without difficulty. She worked with therapy on POD #1 and was meeting her goals. Pt was discharged to home later that day in stable condition.  Diet: Regular diet Activity: WBAT Follow-up: in 2 weeks Disposition: Home Discharged Condition: good   Discharge Instructions    Call MD / Call 911   Complete by: As directed    If you experience chest pain or shortness of breath, CALL 911 and be transported to the hospital emergency room.  If you develope a fever above 101 F, pus (white drainage) or increased drainage or redness at the wound, or calf pain, call your surgeon's office.   Change dressing   Complete by: As directed    Change dressing on Wednesday, then change the dressing daily with sterile 4 x 4 inch gauze dressing and apply TED hose.   Constipation Prevention   Complete by: As directed    Drink plenty of fluids.  Prune juice may be helpful.  You may use a stool softener, such as  Colace (over the counter) 100 mg twice a day.  Use MiraLax (over the counter) for constipation as needed.   Diet - low sodium heart healthy   Complete by: As  directed    Discharge instructions   Complete by: As directed    Dr. Gaynelle Arabian Total Joint Specialist [facility] [address], [place], [place] [address] [phone]  TOTAL KNEE REPLACEMENT POSTOPERATIVE DIRECTIONS  Knee Rehabilitation, Guidelines Following Surgery  Results after knee surgery are often greatly improved when you follow the exercise, range of motion and muscle strengthening exercises prescribed by your doctor. Safety measures are also important to protect the knee from further injury. Any time any of these exercises cause you to have increased pain or swelling in your knee joint, decrease the amount until you are comfortable again and slowly increase them. If you have problems or questions, call your caregiver or physical therapist for advice.   HOME CARE INSTRUCTIONS  Remove items at home which could result in a fall. This includes throw rugs or furniture in walking pathways.  ICE to the affected knee every three hours for 30 minutes at a time and then as needed for pain and swelling.  Continue to use ice on the knee for pain and swelling from surgery. You may notice swelling that will progress down to the foot and ankle.  This is normal after surgery.  Elevate the leg when you are not up walking on it.   Continue to use the breathing machine which will help keep your temperature down.  It is common for your temperature to cycle up and down following surgery, especially at night when you are not up moving around and exerting yourself.  The breathing machine keeps your lungs expanded and your temperature down. Do not place pillow under knee, focus on keeping the knee straight while resting   DIET You may resume your previous home diet once your are discharged from the hospital.  DRESSING / WOUND CARE / SHOWERING You may change your dressing 3-5 days after surgery.  Then change the dressing every day with sterile gauze.  Please use good hand washing  techniques before changing the dressing.  Do not use any lotions or creams on the incision until instructed by your surgeon. You may start showering once you are discharged home but do not submerge the incision under water. Just pat the incision dry and apply a dry gauze dressing on daily. Change the surgical dressing daily and reapply a dry dressing each time.  ACTIVITY Walk with your walker as instructed. Use walker as long as suggested by your caregivers. Avoid periods of inactivity such as sitting longer than an hour when not asleep. This helps prevent blood clots.  You may resume a sexual relationship in one month or when given the OK by your doctor.  You may return to work once you are cleared by your doctor.  Do not drive a car for 6 weeks or until released by you surgeon.  Do not drive while taking narcotics.  WEIGHT BEARING Weight bearing as tolerated with assist device (walker, cane, etc) as directed, use it as long as suggested by your surgeon or therapist, typically at least 4-6 weeks.  POSTOPERATIVE CONSTIPATION PROTOCOL Constipation - defined medically as fewer than three stools per week and severe constipation as less than one stool per week.  One of the most common issues patients have following surgery is constipation.  Even if you have a regular bowel pattern  at home, your normal regimen is likely to be disrupted due to multiple reasons following surgery.  Combination of anesthesia, postoperative narcotics, change in appetite and fluid intake all can affect your bowels.  In order to avoid complications following surgery, here are some recommendations in order to help you during your recovery period.  Colace (docusate) - Pick up an over-the-counter form of Colace or another stool softener and take twice a day as long as you are requiring postoperative pain medications.  Take with a full glass of water daily.  If you experience loose stools or diarrhea, hold the colace until you  stool forms back up.  If your symptoms do not get better within 1 week or if they get worse, check with your doctor.  Dulcolax (bisacodyl) - Pick up over-the-counter and take as directed by the product packaging as needed to assist with the movement of your bowels.  Take with a full glass of water.  Use this product as needed if not relieved by Colace only.   MiraLax (polyethylene glycol) - Pick up over-the-counter to have on hand.  MiraLax is a solution that will increase the amount of water in your bowels to assist with bowel movements.  Take as directed and can mix with a glass of water, juice, soda, coffee, or tea.  Take if you go more than two days without a movement. Do not use MiraLax more than once per day. Call your doctor if you are still constipated or irregular after using this medication for 7 days in a row.  If you continue to have problems with postoperative constipation, please contact the office for further assistance and recommendations.  If you experience "the worst abdominal pain ever" or develop nausea or vomiting, please contact the office immediatly for further recommendations for treatment.  ITCHING  If you experience itching with your medications, try taking only a single pain pill, or even half a pain pill at a time.  You can also use Benadryl over the counter for itching or also to help with sleep.   TED HOSE STOCKINGS Wear the elastic stockings on both legs for three weeks following surgery during the day but you may remove then at night for sleeping.  MEDICATIONS See your medication summary on the "After Visit Summary" that the nursing staff will review with you prior to discharge.  You may have some home medications which will be placed on hold until you complete the course of blood thinner medication.  It is important for you to complete the blood thinner medication as prescribed by your surgeon.  Continue your approved medications as instructed at time of  discharge.  PRECAUTIONS If you experience chest pain or shortness of breath - call 911 immediately for transfer to the hospital emergency department.  If you develop a fever greater that 101 F, purulent drainage from wound, increased redness or drainage from wound, foul odor from the wound/dressing, or calf pain - CONTACT YOUR SURGEON.                                                   FOLLOW-UP APPOINTMENTS Make sure you keep all of your appointments after your operation with your surgeon and caregivers. You should call the office at the above phone number and make an appointment for approximately two weeks after the date of your surgery  or on the date instructed by your surgeon outlined in the "After Visit Summary".   RANGE OF MOTION AND STRENGTHENING EXERCISES  Rehabilitation of the knee is important following a knee injury or an operation. After just a few days of immobilization, the muscles of the thigh which control the knee become weakened and shrink (atrophy). Knee exercises are designed to build up the tone and strength of the thigh muscles and to improve knee motion. Often times heat used for twenty to thirty minutes before working out will loosen up your tissues and help with improving the range of motion but do not use heat for the first two weeks following surgery. These exercises can be done on a training (exercise) mat, on the floor, on a table or on a bed. Use what ever works the best and is most comfortable for you Knee exercises include:  Leg Lifts - While your knee is still immobilized in a splint or cast, you can do straight leg raises. Lift the leg to 60 degrees, hold for 3 sec, and slowly lower the leg. Repeat 10-20 times 2-3 times daily. Perform this exercise against resistance later as your knee gets better.  Quad and Hamstring Sets - Tighten up the muscle on the front of the thigh (Quad) and hold for 5-10 sec. Repeat this 10-20 times hourly. Hamstring sets are done by pushing the  foot backward against an object and holding for 5-10 sec. Repeat as with quad sets.  Leg Slides: Lying on your back, slowly slide your foot toward your buttocks, bending your knee up off the floor (only go as far as is comfortable). Then slowly slide your foot back down until your leg is flat on the floor again. Angel Wings: Lying on your back spread your legs to the side as far apart as you can without causing discomfort.  A rehabilitation program following serious knee injuries can speed recovery and prevent re-injury in the future due to weakened muscles. Contact your doctor or a physical therapist for more information on knee rehabilitation.   IF YOU ARE TRANSFERRED TO A SKILLED REHAB FACILITY If the patient is transferred to a skilled rehab facility following release from the hospital, a list of the current medications will be sent to the facility for the patient to continue.  When discharged from the skilled rehab facility, please have the facility set up the patient's [place] prior to being released. Also, the skilled facility will be responsible for providing the patient with their medications at time of release from the facility to include their pain medication, the muscle relaxants, and their blood thinner medication. If the patient is still at the rehab facility at time of the two week follow up appointment, the skilled rehab facility will also need to assist the patient in arranging follow up appointment in our office and any transportation needs.  MAKE SURE YOU:  Understand these instructions.  Get help right away if you are not doing well or get worse.    Pick up stool softner and laxative for home use following surgery while on pain medications. Do not submerge incision under water. Please use good hand washing techniques while changing dressing each day. May shower starting three days after surgery. Please use a clean towel to pat the incision dry following  showers. Continue to use ice for pain and swelling after surgery. Do not use any lotions or creams on the incision until instructed by your surgeon.   Do not put  a pillow under the knee. Place it under the heel.   Complete by: As directed    Driving restrictions   Complete by: As directed    No driving for two weeks   TED hose   Complete by: As directed    Use stockings (TED hose) for three weeks on both leg(s).  You may remove them at night for sleeping.   Weight bearing as tolerated   Complete by: As directed      Allergies as of 12/16/2018      Reactions   Oxycodone Nausea And Vomiting   Took late at night, made her vomitting   Adhesive [tape] Rash   IF LEFT ON FOR LONG PERIOD OF TIME   Penicillins Rash, Other (See Comments)   febrile Has patient had a PCN reaction causing immediate rash, facial/tongue/throat swelling, SOB or lightheadedness with hypotension: Yes Has patient had a PCN reaction causing severe rash involving mucus membranes or skin necrosis: No Has patient had a PCN reaction that required hospitalization: Yes Has patient had a PCN reaction occurring within the last 10 years: No If all of the above answers are "NO", then may proceed with Cephalosporin use.   Sulfa Antibiotics Other (See Comments), Rash   febrile      Medication List    STOP taking these medications   Calcium + D3 600-200 MG-UNIT Tabs   Cranberry 450 MG Tabs   multivitamin with minerals Tabs tablet     TAKE these medications   atorvastatin 10 MG tablet Commonly known as: LIPITOR Take 5 mg by mouth every evening.   doxycycline 100 MG capsule Commonly known as: VIBRAMYCIN Take 100 mg by mouth 2 (two) times a week.   gabapentin 300 MG capsule Commonly known as: NEURONTIN Take 1 capsule (300 mg total) by mouth 3 (three) times daily. Take a 300 mg capsule three times a day for two weeks following surgery.Then take a 300 mg capsule two times a day for two weeks. Then take a 300 mg  capsule once a day for two weeks. Then discontinue the Gabapentin.   HYDROmorphone 2 MG tablet Commonly known as: DILAUDID Take 1 tablet (2 mg total) by mouth every 6 (six) hours as needed for moderate pain or severe pain.   Lialda 1.2 g EC tablet Generic drug: mesalamine Take 2.4 g by mouth daily with breakfast.   lisinopril 10 MG tablet Commonly known as: ZESTRIL Take 10 mg by mouth daily.   methocarbamol 500 MG tablet Commonly known as: ROBAXIN Take 1 tablet (500 mg total) by mouth every 6 (six) hours as needed for muscle spasms.   metoprolol succinate 50 MG 24 hr tablet Commonly known as: TOPROL-XL Take 50 mg by mouth every morning.   omeprazole 20 MG capsule Commonly known as: PRILOSEC Take 20 mg by mouth 2 (two) times daily.   Osphena 60 MG Tabs Generic drug: Ospemifene Take 60 mg by mouth daily.   oxybutynin 5 MG tablet Commonly known as: DITROPAN Take 5 mg by mouth every morning.   rivaroxaban 10 MG Tabs tablet Commonly known as: XARELTO Take 1 tablet (10 mg total) by mouth daily with breakfast for 20 days. Take one tablet Xarelto once a day for three weeks following surgery. Then change to one baby Aspirin (81 mg) once a day for three weeks. Then discontinue Aspirin.            Discharge Care Instructions  (From admission, onward)  Start     Ordered   12/16/18 0000  Weight bearing as tolerated     12/16/18 0708   12/16/18 0000  Change dressing    Comments: Change dressing on Wednesday, then change the dressing daily with sterile 4 x 4 inch gauze dressing and apply TED hose.   12/16/18 0708         Follow-up Information    Gaynelle Arabian, MD. Schedule an appointment as soon as possible for a visit on 12/30/2018.   Specialty: Orthopedic Surgery Contact information: [address] [place] [address] [phone]           Signed: Griffith Citron, PA-C Orthopedic Surgery 12/17/2018, 12:57 PM

## 2018-12-30 DIAGNOSIS — Z96651 Presence of right artificial knee joint: Secondary | ICD-10-CM | POA: Insufficient documentation

## 2019-03-23 ENCOUNTER — Other Ambulatory Visit: Payer: Self-pay | Admitting: Family Medicine

## 2019-03-23 DIAGNOSIS — Z1231 Encounter for screening mammogram for malignant neoplasm of breast: Secondary | ICD-10-CM

## 2019-04-10 ENCOUNTER — Ambulatory Visit: Payer: Federal, State, Local not specified - PPO | Admitting: Obstetrics and Gynecology

## 2019-05-04 ENCOUNTER — Encounter: Payer: Self-pay | Admitting: Obstetrics and Gynecology

## 2019-05-04 ENCOUNTER — Ambulatory Visit (INDEPENDENT_AMBULATORY_CARE_PROVIDER_SITE_OTHER): Payer: Federal, State, Local not specified - PPO | Admitting: Obstetrics and Gynecology

## 2019-05-04 ENCOUNTER — Other Ambulatory Visit: Payer: Self-pay

## 2019-05-04 VITALS — BP 122/74 | Ht 65.0 in | Wt 161.0 lb

## 2019-05-04 DIAGNOSIS — Z1339 Encounter for screening examination for other mental health and behavioral disorders: Secondary | ICD-10-CM

## 2019-05-04 DIAGNOSIS — Z01419 Encounter for gynecological examination (general) (routine) without abnormal findings: Secondary | ICD-10-CM | POA: Diagnosis not present

## 2019-05-04 DIAGNOSIS — Z1331 Encounter for screening for depression: Secondary | ICD-10-CM

## 2019-05-04 NOTE — Progress Notes (Signed)
Routine Annual Gynecology Examination   PCP: Derinda Late, MD  Chief Complaint  Patient presents with  . Annual Exam   History of Present Illness: Patient is a 64 y.o. G3P3003 presents for annual exam. The patient has no complaints today.   Menopausal bleeding: denies  Menopausal symptoms: denies  Breast symptoms: denies  Last pap smear: 2.5 years ago.  Result Normal, HPV negative  Last mammogram: 1 year ago.  Result Normal   She was prescribed Osphena for dyspareunia.  She wonders whether she should use the medication. She has a history of ER/PR+ breast cancer.  According to the FDA datasheet for Osphena, the medication was not studied in this population and so should not be used.   Past Medical History:  Diagnosis Date  . Arthritis    RIGHT KNEE  . Breast cancer ([place]) 2014   lumpectomy lt  . Cancer of left breast ([place]) 09/14/2014  . Dysrhythmia    PALPITATIONS-WELL CONTROLLED ON METOPROLOL (07-19-15)  . GERD (gastroesophageal reflux disease)   . History of palpitations   . Hypercholesterolemia   . Hypertension   . Overactive bladder   . Personal history of radiation therapy   . Rosacea   . Ulcerative colitis [facility])     Past Surgical History:  Procedure Laterality Date  . 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 BREAST LUMPECTOMY Left 2014   with radiation  . CHOLECYSTECTOMY N/A 07/26/2015   Procedure: LAPAROSCOPIC CHOLECYSTECTOMY WITH INTRAOPERATIVE CHOLANGIOGRAM;  Surgeon: Leonie Green, MD;  Location: [facility] ORS;  Service: General;  Laterality: N/A;  . COLONOSCOPY    . CRYOABLATION      of uterus  . MASTECTOMY Left    Partial  . OOPHORECTOMY Bilateral 2014  . TOTAL KNEE ARTHROPLASTY Left 09/09/2017   Procedure: LEFT TOTAL KNEE ARTHROPLASTY;  Surgeon: Gaynelle Arabian, MD;  Location: [facility];  Service: Orthopedics;  Laterality: Left;  Adductor Block  . TOTAL KNEE ARTHROPLASTY Right 12/15/2018   Procedure: TOTAL KNEE ARTHROPLASTY;  Surgeon:  Gaynelle Arabian, MD;  Location: [facility];  Service: Orthopedics;  Laterality: Right;  53mn    Prior to Admission medications   Medication Sig Start Date End Date Taking? Authorizing Provider  atorvastatin (LIPITOR) 10 MG tablet Take 5 mg by mouth every evening.   Yes [provider]  doxycycline (VIBRAMYCIN) 100 MG capsule Take 100 mg by mouth 2 (two) times a week.    Yes [provider]  gabapentin (NEURONTIN) 300 MG capsule Take 1 capsule (300 mg total) by mouth 3 (three) times daily. Take a 300 mg capsule three times a day for two weeks following surgery.Then take a 300 mg capsule two times a day for two weeks. Then take a 300 mg capsule once a day for two weeks. Then discontinue the Gabapentin. 12/16/18  Yes SMaurice March PA-C  HYDROmorphone (DILAUDID) 2 MG tablet Take 1 tablet (2 mg total) by mouth every 6 (six) hours as needed for moderate pain or severe pain. 12/16/18  Yes SGriffith CitronR, PA-C  lisinopril (ZESTRIL) 10 MG tablet Take 10 mg by mouth daily.  04/28/18  Yes [provider]  mesalamine (LIALDA) 1.2 G EC tablet Take 2.4 g by mouth daily with breakfast.  09/07/13  Yes [provider]  methocarbamol (ROBAXIN) 500 MG tablet Take 1 tablet (500 mg total) by mouth every 6 (six) hours as needed for muscle spasms. 12/16/18  Yes SGriffith CitronR, PA-C  metoprolol succinate (TOPROL-XL)  50 MG 24 hr tablet Take 50 mg by mouth every morning.    Yes [provider]  omeprazole (PRILOSEC) 20 MG capsule Take 20 mg by mouth 2 (two) times daily.  02/16/14  Yes [provider]  OSPHENA 60 MG TABS Take 60 mg by mouth daily.  05/13/18  Yes [provider]  oxybutynin (DITROPAN) 5 MG tablet Take 5 mg by mouth every morning.  01/15/14  Yes [provider]  rivaroxaban (XARELTO) 10 MG TABS tablet Take 1 tablet (10 mg total) by mouth daily with breakfast for 20 days. Take one tablet Xarelto once a day for three weeks following  surgery. Then change to one baby Aspirin (81 mg) once a day for three weeks. Then discontinue Aspirin. 12/16/18 01/05/19  Maurice March, PA-C    Allergies  Allergen Reactions  . Oxycodone Nausea And Vomiting    Took late at night, made her vomitting  . Adhesive [Tape] Rash    IF LEFT ON FOR LONG PERIOD OF TIME  . Penicillins Rash and Other (See Comments)    febrile Has patient had a PCN reaction causing immediate rash, facial/tongue/throat swelling, SOB or lightheadedness with hypotension: Yes Has patient had a PCN reaction causing severe rash involving mucus membranes or skin necrosis: No Has patient had a PCN reaction that required hospitalization: Yes Has patient had a PCN reaction occurring within the last 10 years: No If all of the above answers are "NO", then may proceed with Cephalosporin use.   . Sulfa Antibiotics Other (See Comments) and Rash    febrile   Obstetric History: [id number]  Social History   Socioeconomic History  . Marital status: Married    Spouse name: Not on file  . Number of children: Not on file  . Years of education: Not on file  . Highest education level: Not on file  Occupational History  . Not on file  Tobacco Use  . Smoking status: Never Smoker  . Smokeless tobacco: Never Used  Substance and Sexual Activity  . Alcohol use: Yes    Comment: seldom   . Drug use: No  . Sexual activity: Yes    Birth control/protection: Post-menopausal  Other Topics Concern  . Not on file  Social History Narrative  . Not on file   Social Determinants of Health   Financial Resource Strain:   . Difficulty of Paying Living Expenses: Not on file  Food Insecurity:   . Worried About Charity fundraiser in the Last Year: Not on file  . Ran Out of Food in the Last Year: Not on file  Transportation Needs:   . Lack of Transportation (Medical): Not on file  . Lack of Transportation (Non-Medical): Not on file  Physical Activity:   . Days of Exercise per Week: Not  on file  . Minutes of Exercise per Session: Not on file  Stress:   . Feeling of Stress : Not on file  Social Connections:   . Frequency of Communication with Friends and Family: Not on file  . Frequency of Social Gatherings with Friends and Family: Not on file  . Attends Religious Services: Not on file  . Active Member of Clubs or Organizations: Not on file  . Attends Archivist Meetings: Not on file  . Marital Status: Not on file  Intimate Partner Violence:   . Fear of Current or Ex-Partner: Not on file  . Emotionally Abused: Not on file  . Physically Abused: Not on  file  . Sexually Abused: Not on file    Family History  Problem Relation Age of Onset  . Breast cancer Paternal Aunt   . Ovarian cancer Maternal Grandmother 80  . Breast cancer Cousin   . Breast cancer Cousin     Review of Systems  Constitutional: Negative.   HENT: Negative.   Eyes: Negative.   Respiratory: Negative.   Cardiovascular: Negative.   Gastrointestinal: Negative.   Genitourinary: Negative.   Musculoskeletal: Negative.   Skin: Negative.   Neurological: Negative.   Psychiatric/Behavioral: Negative.      Physical Exam Vitals: BP 122/74   Ht 5' 5"  (1.651 m)   Wt 161 lb (73 kg)   BMI 26.79 kg/m   Physical Exam Constitutional:      General: She is not in acute distress.    Appearance: Normal appearance. She is well-developed.  Genitourinary:     Pelvic exam was performed with patient supine.     Vulva, urethra, bladder and uterus normal.     No inguinal adenopathy present in the right or left side.    No signs of injury in the vagina.     No vaginal discharge, erythema, tenderness or bleeding.     No cervical motion tenderness, discharge, lesion or polyp.     Uterus is mobile.     Uterus is not enlarged or tender.     No uterine mass detected.    Uterus is anteverted.     No right or left adnexal mass present.     Right adnexa not tender or full.     Left adnexa not tender or  full.  HENT:     Head: Normocephalic and atraumatic.  Eyes:     General: No scleral icterus.    Conjunctiva/sclera: Conjunctivae normal.  Neck:     Thyroid: No thyromegaly.  Cardiovascular:     Rate and Rhythm: Normal rate and regular rhythm.     Heart sounds: No murmur. No friction rub. No gallop.   Pulmonary:     Effort: Pulmonary effort is normal. No respiratory distress.     Breath sounds: Normal breath sounds. No wheezing or rales.  Chest:     Comments: Deferred for oncologist Abdominal:     General: Bowel sounds are normal. There is no distension.     Palpations: Abdomen is soft. There is no mass.     Tenderness: There is no abdominal tenderness. There is no guarding or rebound.  Musculoskeletal:        General: No swelling or tenderness. Normal range of motion.     Cervical back: Normal range of motion and neck supple.  Lymphadenopathy:     Cervical: No cervical adenopathy.     Lower Body: No right inguinal adenopathy. No left inguinal adenopathy.  Neurological:     General: No focal deficit present.     Mental Status: She is alert and oriented to person, place, and time.     Cranial Nerves: No cranial nerve deficit.  Skin:    General: Skin is warm and dry.     Findings: No erythema or rash.  Psychiatric:        Mood and Affect: Mood normal.        Behavior: Behavior normal.        Judgment: Judgment normal.      Female chaperone present for pelvic and breast  portions of the physical exam  Results:  AUDIT Questionnaire (screen for alcoholism): low PHQ-9: low  Assessment and Plan:  64 y.o. G39P3003 female here for routine annual gynecologic examination  Plan: Problem List Items Addressed This Visit    None    Visit Diagnoses    Women's annual routine gynecological examination    -  Primary   Screening for depression       Screening for alcoholism         Screening: -- Blood pressure screen managed by PCP -- Colonoscopy - has Ulcerative Colitis.  Managed by her gastroenterologist. -- Mammogram - due - already scheduled at [facility] on 05/15/2019 -- Weight screening: normal -- Depression screening negative (PHQ-9) -- Nutrition: normal -- cholesterol screening: per PCP -- osteoporosis screening: not due -- tobacco screening: not using -- alcohol screening: AUDIT questionnaire indicates low-risk usage. -- family history of breast cancer screening: done. not at high risk. -- no evidence of domestic violence or intimate partner violence. -- STD screening: gonorrhea/chlamydia NAAT not collected per patient request. -- pap smear not collected per ASCCP guidelines -- flu vaccine received this season -- HPV vaccination series: not eligilbe  Dyspareunia: She was given an Rx for Osphena.  Based on FDA recommendations, I told her I recommended that she not use this medication.   Prentice Docker, MD 05/04/2019 4:34 PM

## 2019-05-15 ENCOUNTER — Ambulatory Visit
Admission: RE | Admit: 2019-05-15 | Discharge: 2019-05-15 | Disposition: A | Discharge status: Home / Self Care | Payer: Federal, State, Local not specified - PPO | Source: Ambulatory Visit | Attending: Family Medicine | Admitting: Family Medicine

## 2019-05-15 DIAGNOSIS — Z1231 Encounter for screening mammogram for malignant neoplasm of breast: Secondary | ICD-10-CM | POA: Diagnosis not present

## 2019-05-23 ENCOUNTER — Other Ambulatory Visit: Payer: Self-pay | Admitting: Oncology

## 2019-06-16 ENCOUNTER — Other Ambulatory Visit: Payer: Self-pay | Admitting: Oncology

## 2019-06-17 ENCOUNTER — Other Ambulatory Visit: Payer: Self-pay | Admitting: *Deleted

## 2019-06-27 NOTE — Progress Notes (Signed)
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  INTERVAL HISTORY: Patient returns to clinic today for routine yearly evaluation.  She completed 5 years of letrozole in June 2019.  She continues to feel well and remains asymptomatic. She has no neurologic complaints. She denies any recent fevers or illnesses. She has a good appetite and denies weight loss.  She denies any chest pain, shortness of breath, cough, or hemoptysis.  She denies any nausea, vomiting, constipation, or diarrhea. She has no urinary complaints.  Patient offers no specific complaints today.  REVIEW OF SYSTEMS:   Review of Systems  Constitutional: Negative.  Negative for fever, malaise/fatigue and weight loss.  Respiratory: Negative.  Negative for cough and shortness of breath.   Cardiovascular: Negative.  Negative for chest pain and leg swelling.  Gastrointestinal: Negative.  Negative for abdominal pain.  Genitourinary: Negative.  Negative for dysuria.  Musculoskeletal: Negative.  Negative for neck pain.  Skin: Negative.  Negative for rash.  Neurological: Negative.  Negative for dizziness, sensory change, weakness and headaches.  Psychiatric/Behavioral: Negative.  The patient is not nervous/anxious.     As per HPI. Otherwise, a complete review of systems is negative.  ONCOLOGY HISTORY: Oncology History Overview Note  64 year old female with pathologic stage Ib (T1 B. N0 M0) invasive mammary carcinoma status post wide local excision and sentinel node biopsy ER/PR positive HER-2/neu negative by fish 2,Oncotype Dx  score is 6% (low) 3.  Finished radiation therapy may 2014 4,  Fem[place] of  June of 2014 5.patient had  bilateral oophorectomy inNovember of 2014   Breast cancer of upper-outer quadrant of left female breast [facility])    PAST MEDICAL HISTORY: Past Medical History:  Diagnosis Date  . Arthritis    RIGHT KNEE  . Breast cancer ([place]) 2014   lumpectomy lt  . Cancer of left breast ([place]) 09/14/2014  . Dysrhythmia    PALPITATIONS-WELL CONTROLLED ON METOPROLOL (07-19-15)  . GERD (gastroesophageal reflux disease)   . History of palpitations   . Hypercholesterolemia   . Hypertension   . Overactive bladder   . Personal history of radiation therapy   . Rosacea   . Ulcerative colitis ([place])     PAST SURGICAL HISTORY: Past Surgical History:  Procedure Laterality Date  . 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 BREAST LUMPECTOMY Left 2014   with radiation  . CHOLECYSTECTOMY N/A 07/26/2015   Procedure: LAPAROSCOPIC CHOLECYSTECTOMY WITH INTRAOPERATIVE CHOLANGIOGRAM;  Surgeon: Leonie Green, MD;  Location: [facility] ORS;  Service: General;  Laterality: N/A;  . COLONOSCOPY    . CRYOABLATION      of uterus  . OOPHORECTOMY Bilateral 2014  . TOTAL KNEE ARTHROPLASTY Left 09/09/2017   Procedure: LEFT TOTAL KNEE ARTHROPLASTY;  Surgeon: Gaynelle Arabian, MD;  Location: [facility];  Service: Orthopedics;  Laterality: Left;  Adductor Block  . TOTAL KNEE ARTHROPLASTY Right 12/15/2018   Procedure: TOTAL KNEE ARTHROPLASTY;  Surgeon: Gaynelle Arabian, MD;  Location: [facility];  Service: Orthopedics;  Laterality: Right;  67mn    FAMILY HISTORY Family History  Problem Relation Age of Onset  . Breast cancer Paternal Aunt   . Ovarian cancer Maternal Grandmother 80  . Breast cancer Cousin   .  Breast cancer Cousin     GYNECOLOGIC HISTORY:  No LMP recorded. Patient is postmenopausal.     ADVANCED DIRECTIVES:    HEALTH MAINTENANCE: Social History   Tobacco Use  . Smoking status: Never Smoker  . Smokeless tobacco: Never Used  Substance Use Topics  . Alcohol use: Yes    Comment: seldom   . Drug use:  No     Colonoscopy:  PAP:  Bone density:  Mammogram: 04/2015  Allergies  Allergen Reactions  . Oxycodone Nausea And Vomiting    Took late at night, made her vomitting  . Adhesive [Tape] Rash    IF LEFT ON FOR LONG PERIOD OF TIME  . Penicillins Rash and Other (See Comments)    febrile Has patient had a PCN reaction causing immediate rash, facial/tongue/throat swelling, SOB or lightheadedness with hypotension: Yes Has patient had a PCN reaction causing severe rash involving mucus membranes or skin necrosis: No Has patient had a PCN reaction that required hospitalization: Yes Has patient had a PCN reaction occurring within the last 10 years: No If all of the above answers are "NO", then may proceed with Cephalosporin use.   . Sulfa Antibiotics Other (See Comments) and Rash    febrile    Current Outpatient Medications  Medication Sig Dispense Refill  . atorvastatin (LIPITOR) 10 MG tablet Take 5 mg by mouth every evening.    . Calcium Carbonate-Vitamin D 600-200 MG-UNIT TABS Take 1 tablet by mouth 2 (two) times daily.    Marland Kitchen doxycycline (VIBRAMYCIN) 100 MG capsule Take 100 mg by mouth 2 (two) times a week.     Marland Kitchen lisinopril (ZESTRIL) 10 MG tablet Take 10 mg by mouth daily.     . mesalamine (LIALDA) 1.2 G EC tablet Take 4.8 g by mouth daily with breakfast.     . metoprolol succinate (TOPROL-XL) 50 MG 24 hr tablet Take 50 mg by mouth every morning.     Marland Kitchen omeprazole (PRILOSEC) 20 MG capsule Take 20 mg by mouth 2 (two) times daily.     Marland Kitchen oxybutynin (DITROPAN-XL) 5 MG 24 hr tablet Take 5 mg by mouth daily.     No current facility-administered medications for this visit.    OBJECTIVE: BP (!) 146/79 (BP Location: Right Arm, Patient Position: Sitting, Cuff Size: Normal)   Pulse 72   Temp 98.6 F (37 C) (Tympanic)   Resp 20   Wt 163 lb 11.2 oz (74.3 kg)   SpO2 100%   BMI 27.24 kg/m    Body mass index is 27.24 kg/m.    ECOG FS:0 - Asymptomatic  General: Well-developed,  well-nourished, no acute distress. Eyes: Pink conjunctiva, anicteric sclera. HEENT: Normocephalic, moist mucous membranes. Breast: Bilateral breast and axilla without lumps or masses. Lungs: No audible wheezing or coughing. Heart: Regular rate and rhythm. Abdomen: Soft, nontender, no obvious distention. Musculoskeletal: No edema, cyanosis, or clubbing. Neuro: Alert, answering all questions appropriately. Cranial nerves grossly intact. Skin: No rashes or petechiae noted. Psych: Normal affect.  LAB RESULTS:   STUDIES: No results found.  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  PLAN:    1. 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  Patient completed 5 years of letrozole in June 2019.  Her most recent mammogram on May 15, 2019 was reported as BI-RADS 1.  Repeat in March 2022.  After lengthy discussion  with the patient, is agreed upon that no further follow-up is necessary.  Mammograms and breast exam can be completed by either primary care or gynecology.  Please refer patient back if there are any questions or concerns.    2. Postmenopausal: Patient's most recent bone mineral density completed on May 14, 2018 revealed a T score of -1.3 which is slightly decreased from 2 years prior when it was reported at -1.0.  No intervention is needed at this time.  Continue calcium and vitamin D supplementation.  Repeat bone mineral density in March 2022 at the discretion of primary care.  I spent a total of 20 minutes reviewing chart data, face-to-face evaluation with the patient, counseling and coordination of care as detailed above.  Patient expressed understanding and was in agreement with this plan. She also understands that She can call clinic at any time with any questions, concerns, or complaints.    Lloyd Huger, MD   07/03/2019 7:00 AM

## 2019-07-01 ENCOUNTER — Encounter: Payer: Self-pay | Admitting: Oncology

## 2019-07-01 ENCOUNTER — Other Ambulatory Visit: Payer: Self-pay

## 2019-07-01 NOTE — Progress Notes (Signed)
Patient prescreened for appointment. Pt wondering if Dr. Grayland Ormond would be doing breast exam or if PCP or ob/gyn should be doing.

## 2019-07-02 ENCOUNTER — Encounter: Payer: Self-pay | Admitting: Oncology

## 2019-07-02 ENCOUNTER — Inpatient Hospital Stay: Payer: Federal, State, Local not specified - PPO | Attending: Oncology | Admitting: Oncology

## 2019-07-02 VITALS — BP 146/79 | HR 72 | Temp 98.6°F | Resp 20 | Wt 163.7 lb

## 2019-07-02 DIAGNOSIS — I10 Essential (primary) hypertension: Secondary | ICD-10-CM | POA: Insufficient documentation

## 2019-07-02 DIAGNOSIS — Z853 Personal history of malignant neoplasm of breast: Secondary | ICD-10-CM | POA: Insufficient documentation

## 2019-07-02 DIAGNOSIS — Z803 Family history of malignant neoplasm of breast: Secondary | ICD-10-CM | POA: Insufficient documentation

## 2019-07-02 DIAGNOSIS — K219 Gastro-esophageal reflux disease without esophagitis: Secondary | ICD-10-CM | POA: Diagnosis not present

## 2019-07-02 DIAGNOSIS — Z170 Estrogen receptor positive status [ER+]: Secondary | ICD-10-CM | POA: Diagnosis not present

## 2019-07-02 DIAGNOSIS — Z8041 Family history of malignant neoplasm of ovary: Secondary | ICD-10-CM | POA: Insufficient documentation

## 2019-07-02 DIAGNOSIS — Z923 Personal history of irradiation: Secondary | ICD-10-CM | POA: Insufficient documentation

## 2019-07-02 DIAGNOSIS — C50412 Malignant neoplasm of upper-outer quadrant of left female breast: Secondary | ICD-10-CM

## 2019-07-02 DIAGNOSIS — Z90722 Acquired absence of ovaries, bilateral: Secondary | ICD-10-CM | POA: Diagnosis not present

## 2019-07-02 DIAGNOSIS — E7800 Pure hypercholesterolemia, unspecified: Secondary | ICD-10-CM | POA: Diagnosis not present

## 2019-07-02 DIAGNOSIS — Z79899 Other long term (current) drug therapy: Secondary | ICD-10-CM | POA: Diagnosis not present

## 2020-01-11 ENCOUNTER — Ambulatory Visit: Payer: Federal, State, Local not specified - PPO | Attending: Internal Medicine

## 2020-01-11 DIAGNOSIS — Z23 Encounter for immunization: Secondary | ICD-10-CM

## 2020-01-11 NOTE — Progress Notes (Signed)
   Covid-19 Vaccination Clinic  Name:  Debbie Floyd    MRN: [id number] DOB: Jun 23, 1955  01/11/2020  [name] was observed post Covid-19 immunization for 15 minutes   Covid-19 Vaccination Clinic  Name:  Debbie Floyd    MRN: [id number] DOB: 01-Oct-1955  01/11/2020  [name] was observed post Covid-19 immunization for 15 minutes without incident. She was provided with Vaccine Information Sheet and instruction to access the V-Safe system.   [name] was instructed to call 911 with any severe reactions post vaccine: Marland Kitchen Difficulty breathing  . Swelling of face and throat  . A fast heartbeat  . A bad rash all over body  . Dizziness and weakness     without incident. She was provided with Vaccine Information Sheet and instruction to access the V-Safe system.   [name] was instructed to call 911 with any severe reactions post vaccine: Marland Kitchen Difficulty breathing  . Swelling of face and throat  . A fast heartbeat  . A bad rash all over body  . Dizziness and weakness

## 2020-03-30 ENCOUNTER — Other Ambulatory Visit: Payer: Self-pay | Admitting: Family Medicine

## 2020-03-30 DIAGNOSIS — Z1231 Encounter for screening mammogram for malignant neoplasm of breast: Secondary | ICD-10-CM

## 2020-05-13 ENCOUNTER — Ambulatory Visit (INDEPENDENT_AMBULATORY_CARE_PROVIDER_SITE_OTHER): Payer: Federal, State, Local not specified - PPO | Admitting: Obstetrics and Gynecology

## 2020-05-13 ENCOUNTER — Other Ambulatory Visit: Payer: Self-pay

## 2020-05-13 ENCOUNTER — Encounter: Payer: Self-pay | Admitting: Obstetrics and Gynecology

## 2020-05-13 VITALS — BP 126/80 | Ht 65.0 in | Wt 170.0 lb

## 2020-05-13 DIAGNOSIS — Z1339 Encounter for screening examination for other mental health and behavioral disorders: Secondary | ICD-10-CM | POA: Diagnosis not present

## 2020-05-13 DIAGNOSIS — Z01419 Encounter for gynecological examination (general) (routine) without abnormal findings: Secondary | ICD-10-CM

## 2020-05-13 DIAGNOSIS — Z1331 Encounter for screening for depression: Secondary | ICD-10-CM | POA: Diagnosis not present

## 2020-05-13 NOTE — Progress Notes (Signed)
Routine Annual Gynecology Examination   PCP: Derinda Late, MD  Chief Complaint  Patient presents with  . Annual Exam   History of Present Illness: Patient is a 65 y.o. G3P3003 presents for annual exam. The patient has no complaints today.   Menopausal bleeding: denies  Menopausal symptoms: denies.  Occasionally gets a hot flash.   Breast symptoms: denies.  She no longer is required to see the oncologist   Last pap smear: 3.5 years ago.  Result Normal, HPV negative  Last mammogram: 1 year ago.  Result Normal   She continues to have pain with intercourse.  She has tried various things.   Past Medical History:  Diagnosis Date  . Arthritis    RIGHT KNEE  . Breast cancer ([place]) 2014   lumpectomy lt  . Cancer of left breast ([place]) 09/14/2014  . Dysrhythmia    PALPITATIONS-WELL CONTROLLED ON METOPROLOL (07-19-15)  . GERD (gastroesophageal reflux disease)   . History of palpitations   . Hypercholesterolemia   . Hypertension   . Overactive bladder   . Personal history of radiation therapy   . Rosacea   . Ulcerative colitis [facility])     Past Surgical History:  Procedure Laterality Date  . 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 BREAST LUMPECTOMY Left 2014   with radiation  . CHOLECYSTECTOMY N/A 07/26/2015   Procedure: LAPAROSCOPIC CHOLECYSTECTOMY WITH INTRAOPERATIVE CHOLANGIOGRAM;  Surgeon: Leonie Green, MD;  Location: [facility] ORS;  Service: General;  Laterality: N/A;  . COLONOSCOPY    . CRYOABLATION      of uterus  . OOPHORECTOMY Bilateral 2014  . TOTAL KNEE ARTHROPLASTY Left 09/09/2017   Procedure: LEFT TOTAL KNEE ARTHROPLASTY;  Surgeon: Gaynelle Arabian, MD;  Location: [facility];  Service: Orthopedics;  Laterality: Left;  Adductor Block  . TOTAL KNEE ARTHROPLASTY Right 12/15/2018   Procedure: TOTAL KNEE ARTHROPLASTY;  Surgeon: Gaynelle Arabian, MD;  Location: [facility];  Service: Orthopedics;  Laterality: Right;  26mn    Prior to Admission medications    Medication Sig Start Date End Date Taking? Authorizing Provider  atorvastatin (LIPITOR) 10 MG tablet Take 5 mg by mouth every evening.   Yes [provider]  doxycycline (VIBRAMYCIN) 100 MG capsule Take 100 mg by mouth 2 (two) times a week.    Yes [provider]  gabapentin (NEURONTIN) 300 MG capsule Take 1 capsule (300 mg total) by mouth 3 (three) times daily. Take a 300 mg capsule three times a day for two weeks following surgery.Then take a 300 mg capsule two times a day for two weeks. Then take a 300 mg capsule once a day for two weeks. Then discontinue the Gabapentin. 12/16/18  Yes SMaurice March PA-C  HYDROmorphone (DILAUDID) 2 MG tablet Take 1 tablet (2 mg total) by mouth every 6 (six) hours as needed for moderate pain or severe pain. 12/16/18  Yes SGriffith CitronR, PA-C  lisinopril (ZESTRIL) 10 MG tablet Take 10 mg by mouth daily.  04/28/18  Yes [provider]  mesalamine (LIALDA) 1.2 G EC tablet Take 2.4 g by mouth daily with breakfast.  09/07/13  Yes [provider]  methocarbamol (ROBAXIN) 500 MG tablet Take 1 tablet (500 mg total) by mouth every 6 (six) hours as needed for muscle spasms. 12/16/18  Yes SMaurice March PA-C  metoprolol succinate (TOPROL-XL) 50 MG 24 hr tablet Take 50 mg by mouth every morning.    Yes [provider]  omeprazole ([place]  20 MG capsule Take 20 mg by mouth 2 (two) times daily.  02/16/14  Yes [provider]  OSPHENA 60 MG TABS Take 60 mg by mouth daily.  05/13/18  Yes [provider]  oxybutynin (DITROPAN) 5 MG tablet Take 5 mg by mouth every morning.  01/15/14  Yes [provider]  rivaroxaban (XARELTO) 10 MG TABS tablet Take 1 tablet (10 mg total) by mouth daily with breakfast for 20 days. Take one tablet Xarelto once a day for three weeks following surgery. Then change to one baby Aspirin (81 mg) once a day for three weeks. Then discontinue Aspirin. 12/16/18 01/05/19  Maurice March, PA-C    Allergies  Allergen Reactions  . Oxycodone Nausea And Vomiting    Took late at night, made her vomitting  . Adhesive [Tape] Rash    IF LEFT ON FOR LONG PERIOD OF TIME  . Penicillins Rash and Other (See Comments)    febrile Has patient had a PCN reaction causing immediate rash, facial/tongue/throat swelling, SOB or lightheadedness with hypotension: Yes Has patient had a PCN reaction causing severe rash involving mucus membranes or skin necrosis: No Has patient had a PCN reaction that required hospitalization: Yes Has patient had a PCN reaction occurring within the last 10 years: No If all of the above answers are "NO", then may proceed with Cephalosporin use.   . Sulfa Antibiotics Other (See Comments) and Rash    febrile   Obstetric History: [id number]  Social History   Socioeconomic History  . Marital status: Married    Spouse name: Not on file  . Number of children: Not on file  . Years of education: Not on file  . Highest education level: Not on file  Occupational History  . Not on file  Tobacco Use  . Smoking status: Never Smoker  . Smokeless tobacco: Never Used  Vaping Use  . Vaping Use: Never used  Substance and Sexual Activity  . Alcohol use: Yes    Comment: seldom   . Drug use: No  . Sexual activity: Yes    Birth control/protection: Post-menopausal  Other Topics Concern  . Not on file  Social History Narrative  . Not on file   Social Determinants of Health   Financial Resource Strain: Not on file  Food Insecurity: Not on file  Transportation Needs: Not on file  Physical Activity: Not on file  Stress: Not on file  Social Connections: Not on file  Intimate Partner Violence: Not on file    Family History  Problem Relation Age of Onset  . Breast cancer Paternal Aunt   . Ovarian cancer Maternal Grandmother 80  . Breast cancer Cousin   . Breast cancer Cousin     Review of Systems  Constitutional: Negative.   HENT: Negative.   Eyes:  Negative.   Respiratory: Negative.   Cardiovascular: Negative.   Gastrointestinal: Negative.   Genitourinary: Negative.   Musculoskeletal: Negative.   Skin: Negative.   Neurological: Negative.   Psychiatric/Behavioral: Negative.      Physical Exam Vitals: BP 126/80   Ht 5' 5"  (1.651 m)   Wt 170 lb (77.1 kg)   BMI 28.29 kg/m   Physical Exam Constitutional:      General: She is not in acute distress.    Appearance: Normal appearance. She is well-developed.  Genitourinary:     Vulva and bladder normal.     No vaginal discharge, erythema, tenderness or bleeding.      Right  Adnexa: not tender, not full and no mass present.    Left Adnexa: not tender, not full and no mass present.    No cervical motion tenderness, discharge, lesion or polyp.     Uterus is not enlarged or tender.     No uterine mass detected. Breasts:     Tanner Score is 5.     Right: No swelling, bleeding, inverted nipple, mass, nipple discharge, skin change, tenderness, axillary adenopathy or supraclavicular adenopathy.     Left: No swelling, bleeding, inverted nipple, mass, nipple discharge, skin change, tenderness, axillary adenopathy or supraclavicular adenopathy.    HENT:     Head: Normocephalic and atraumatic.  Eyes:     General: No scleral icterus.    Conjunctiva/sclera: Conjunctivae normal.  Neck:     Thyroid: No thyromegaly.  Cardiovascular:     Rate and Rhythm: Normal rate and regular rhythm.     Heart sounds: No murmur heard. No friction rub. No gallop.   Pulmonary:     Effort: Pulmonary effort is normal. No respiratory distress.     Breath sounds: Normal breath sounds. No wheezing or rales.  Abdominal:     General: Bowel sounds are normal. There is no distension.     Palpations: Abdomen is soft. There is no mass.     Tenderness: There is no abdominal tenderness. There is no guarding or rebound.  Musculoskeletal:        General: No swelling or tenderness. Normal range of motion.      Cervical back: Normal range of motion and neck supple.  Lymphadenopathy:     Cervical: No cervical adenopathy.     Upper Body:     Right upper body: No supraclavicular or axillary adenopathy.     Left upper body: No supraclavicular or axillary adenopathy.     Lower Body: No right inguinal adenopathy. No left inguinal adenopathy.  Neurological:     General: No focal deficit present.     Mental Status: She is alert and oriented to person, place, and time.     Cranial Nerves: No cranial nerve deficit.  Skin:    General: Skin is warm and dry.     Findings: No erythema or rash.  Psychiatric:        Mood and Affect: Mood normal.        Behavior: Behavior normal.        Judgment: Judgment normal.      Female chaperone present for pelvic and breast  portions of the physical exam  Results:  AUDIT Questionnaire (screen for alcoholism): 1 PHQ-9: 0   Assessment and Plan:  65 y.o. G27P3003 female here for routine annual gynecologic examination  Plan: Problem List Items Addressed This Visit   None   Visit Diagnoses    Women's annual routine gynecological examination    -  Primary   Screening for depression       Screening for alcoholism         Screening: -- Blood pressure screen managed by PCP -- Colonoscopy - has Ulcerative Colitis. Managed by her gastroenterologist. -- Mammogram - due - already scheduled [facility] on 05[facility]18/2019 -- Weight screening: normal -- Depression screening negative (PHQ-9) -- Nutrition: normal -- cholesterol screening: per PCP -- osteoporosis screening: not due -- tobacco screening: not using -- alcohol screening: AUDIT questionnaire indicates low-risk usage. -- family history of breast cancer screening: done. not at high risk. -- no evidence of domestic violence or intimate partner violence. -- STD screening:  gonorrhea/chlamydia NAAT not collected per patient request. -- pap smear not collected per ASCCP guidelines -- flu vaccine received this  season -- HPV vaccination series: not eligilbe  -- she has received her COVID19 vaccines + booster -- recommended shingles vaccine.   Prentice Docker, MD 05/13/2020 10:09 AM

## 2020-05-17 ENCOUNTER — Ambulatory Visit
Admission: RE | Admit: 2020-05-17 | Discharge: 2020-05-17 | Disposition: A | Discharge status: Home / Self Care | Payer: Federal, State, Local not specified - PPO | Source: Ambulatory Visit | Attending: Family Medicine | Admitting: Family Medicine

## 2020-05-17 ENCOUNTER — Other Ambulatory Visit: Payer: Self-pay

## 2020-05-17 DIAGNOSIS — Z1231 Encounter for screening mammogram for malignant neoplasm of breast: Secondary | ICD-10-CM | POA: Diagnosis present

## 2021-04-18 ENCOUNTER — Other Ambulatory Visit: Payer: Self-pay | Admitting: Family Medicine

## 2021-04-18 DIAGNOSIS — Z1231 Encounter for screening mammogram for malignant neoplasm of breast: Secondary | ICD-10-CM

## 2021-05-24 ENCOUNTER — Ambulatory Visit
Admission: RE | Admit: 2021-05-24 | Discharge: 2021-05-24 | Disposition: A | Discharge status: Home / Self Care | Payer: Federal, State, Local not specified - PPO | Source: Ambulatory Visit | Attending: Family Medicine | Admitting: Family Medicine

## 2021-05-24 ENCOUNTER — Other Ambulatory Visit: Payer: Self-pay

## 2021-05-24 DIAGNOSIS — Z1231 Encounter for screening mammogram for malignant neoplasm of breast: Secondary | ICD-10-CM | POA: Insufficient documentation

## 2022-04-23 ENCOUNTER — Other Ambulatory Visit: Payer: Self-pay | Admitting: Family Medicine

## 2022-04-23 DIAGNOSIS — Z1231 Encounter for screening mammogram for malignant neoplasm of breast: Secondary | ICD-10-CM

## 2022-05-28 ENCOUNTER — Ambulatory Visit
Admission: RE | Admit: 2022-05-28 | Discharge: 2022-05-28 | Disposition: A | Discharge status: Home / Self Care | Payer: Medicare Other | Source: Ambulatory Visit | Attending: Family Medicine | Admitting: Family Medicine

## 2022-05-28 DIAGNOSIS — Z1231 Encounter for screening mammogram for malignant neoplasm of breast: Secondary | ICD-10-CM | POA: Diagnosis not present

## 2023-04-09 ENCOUNTER — Other Ambulatory Visit: Payer: Self-pay | Admitting: Family Medicine

## 2023-04-09 DIAGNOSIS — Z1231 Encounter for screening mammogram for malignant neoplasm of breast: Secondary | ICD-10-CM

## 2023-05-29 ENCOUNTER — Ambulatory Visit
Admission: RE | Admit: 2023-05-29 | Discharge: 2023-05-29 | Disposition: A | Discharge status: Home / Self Care | Payer: Medicare Other | Source: Ambulatory Visit | Attending: Family Medicine | Admitting: Family Medicine

## 2023-05-29 DIAGNOSIS — Z1231 Encounter for screening mammogram for malignant neoplasm of breast: Secondary | ICD-10-CM | POA: Insufficient documentation

## 2023-06-03 ENCOUNTER — Other Ambulatory Visit: Payer: Self-pay | Admitting: Family Medicine

## 2023-06-03 DIAGNOSIS — R928 Other abnormal and inconclusive findings on diagnostic imaging of breast: Secondary | ICD-10-CM

## 2023-06-06 ENCOUNTER — Ambulatory Visit
Admission: RE | Admit: 2023-06-06 | Discharge: 2023-06-06 | Disposition: A | Discharge status: Home / Self Care | Source: Ambulatory Visit | Attending: Family Medicine | Admitting: Family Medicine

## 2023-06-06 DIAGNOSIS — R928 Other abnormal and inconclusive findings on diagnostic imaging of breast: Secondary | ICD-10-CM | POA: Diagnosis present

## 2023-12-19 ENCOUNTER — Ambulatory Visit: Payer: Self-pay

## 2023-12-19 DIAGNOSIS — K5289 Other specified noninfective gastroenteritis and colitis: Secondary | ICD-10-CM | POA: Diagnosis not present

## 2023-12-19 DIAGNOSIS — K640 First degree hemorrhoids: Secondary | ICD-10-CM | POA: Diagnosis not present

## 2023-12-28 NOTE — Progress Notes (Unsigned)
 Cardiology Office Note  Date:  12/30/2023   ID:  Debbie Floyd, DOB Nov 21, 1955, MRN [id number]  PCP:  Diedra Lame, MD   Chief Complaint  Patient presents with   New Patient (Initial Visit)    Self referral for uncontrolled hypertension. Patient c/o dizziness, palpitations & shortness of breath with over exertion.      HPI:  Debbie Floyd is a 68 y.o. female with past medical history of: Chronic ulcerative colitis/ulcerative rectosigmoiditis Essential hypertension Palpitations Hyperlipidemia GERD Who presents by self-referral for hypertension  Reports that she started having high blood pressure end of the summer 2025 Systolic pressure 150 up to 160 on a regular basis  Recently seen by GI November 26, 2023 blood pressure 155/76  Seen by primary care November 18, 2023 blood pressure 158/96  Lisinopril was increased up to 30 then 40 mg Metoprolol  succinate dosing increased from 50 now up to 100 HCTZ 25 was added  Recording blood pressure at home typically running 110 up to 120 systolic, better control  Currently taking all 3 of her medications in the morning Orthostatics negative today in the office Supine 121/75 Sitting 116/78 Standing 122/69 Standing 3 minutes 119/75 Heart rate 60-70  Reports feeling well, occasional episodes of lightheadedness that is fleeting from a sitting position Does not happen on a regular basis  EKG personally reviewed by myself on todays visit EKG Interpretation Date/Time:  Monday December 30 2023 09:41:44 EDT Ventricular Rate:  67 PR Interval:  130 QRS Duration:  88 QT Interval:  412 QTC Calculation: 435 R Axis:   -11  Text Interpretation: Normal sinus rhythm Minimal voltage criteria for LVH, may be normal variant ( R in aVL ) When compared with ECG of 22-Apr-2015 11:18, No significant change was found Confirmed by Perla Lye [phone]) on 12/30/2023 9:57:11 AM    Family hx: Mother: heart skips   Past Medical History:   Diagnosis Date   Arthritis    RIGHT KNEE   Breast cancer ([facility]) 2014   lumpectomy lt   Cancer of left breast ([facility]) 09/14/2014   Dysrhythmia    PALPITATIONS-WELL CONTROLLED ON METOPROLOL  (07-19-15)   GERD (gastroesophageal reflux disease)    History of palpitations    Hypercholesterolemia    Hypertension    Overactive bladder    Personal history of radiation therapy    Rosacea    Ulcerative colitis ([facility])      PMH:   has a past medical history of Arthritis, Breast cancer ([facility]) (2014), Cancer of left breast ([facility]) (09/14/2014), Dysrhythmia, GERD (gastroesophageal reflux disease), History of palpitations, Hypercholesterolemia, Hypertension, Overactive bladder, Personal history of radiation therapy, Rosacea, and Ulcerative colitis ([facility]).   PSH:    Past Surgical History:  Procedure Laterality Date   BREAST BIOPSY Left 2014   Invasive ductal carcinoma   BREAST LUMPECTOMY Left 2014   with radiation   CHOLECYSTECTOMY N/A 07/26/2015   Procedure: LAPAROSCOPIC CHOLECYSTECTOMY WITH INTRAOPERATIVE CHOLANGIOGRAM;  Surgeon: Larinda Unknown Sharps, MD;  Location: [facility] ORS;  Service: General;  Laterality: N/A;   COLONOSCOPY     CRYOABLATION      of uterus   OOPHORECTOMY Bilateral 2014   TOTAL KNEE ARTHROPLASTY Left 09/09/2017   Procedure: LEFT TOTAL KNEE ARTHROPLASTY;  Surgeon: Melodi Lerner, MD;  Location: [facility];  Service: Orthopedics;  Laterality: Left;  Adductor Block   TOTAL KNEE ARTHROPLASTY Right 12/15/2018   Procedure: TOTAL KNEE ARTHROPLASTY;  Surgeon: Melodi Lerner, MD;  Location: [facility];  Service: Orthopedics;  Laterality: Right;     Current Outpatient Medications  Medication Sig Dispense Refill   ascorbic acid (VITAMIN C) 1000 MG tablet Take 1,000 mg by mouth daily.     atorvastatin  (LIPITOR) 10 MG tablet Take 5 mg by mouth every evening.     Calcium  Carbonate-Vitamin D 600-200 MG-UNIT TABS Take 1 tablet by mouth 2 (two) times daily.     doxycycline (VIBRAMYCIN) 100 MG capsule  Take 100 mg by mouth 2 (two) times a week.      hydrochlorothiazide (HYDRODIURIL) 25 MG tablet Take 25 mg by mouth daily.     lisinopril (ZESTRIL) 20 MG tablet Take 40 mg by mouth daily.     Loteprednol Etabonate 0.5 % GEL Apply to eye.     mesalamine  (LIALDA ) 1.2 g EC tablet Take 4.8 g by mouth daily with breakfast.      metoprolol  succinate (TOPROL -XL) 100 MG 24 hr tablet Take 100 mg by mouth daily. Take with or immediately following a meal.     Multiple Vitamin (MULTI-VITAMIN) tablet Take 1 tablet by mouth daily.     omeprazole (PRILOSEC) 20 MG capsule Take 20 mg by mouth 2 (two) times daily.      oxybutynin  (DITROPAN -XL) 5 MG 24 hr tablet Take 5 mg by mouth daily.     Zinc Sulfate 66 (15 Zn) MG TABS Take by mouth daily.     No current facility-administered medications for this visit.    Allergies:   Oxycodone , Adhesive [tape], Penicillins, and Sulfa antibiotics   Social History:  The patient  reports that she has never smoked. She has never used smokeless tobacco. She reports current alcohol use. She reports that she does not use drugs.   Family History:   family history includes Arrhythmia in her mother; Breast cancer in her cousin, cousin, and paternal aunt; Hyperlipidemia in her mother; Ovarian cancer (age of onset: 4) in her maternal grandmother; Pancreatic cancer in her father.    Review of Systems: Review of Systems  Constitutional: Negative.   HENT: Negative.    Respiratory: Negative.    Cardiovascular: Negative.   Gastrointestinal: Negative.   Musculoskeletal: Negative.   Neurological: Negative.   Psychiatric/Behavioral: Negative.    All other systems reviewed and are negative.    PHYSICAL EXAM: VS:  BP (!) 122/92 (BP Location: Right Arm, Patient Position: Sitting, Cuff Size: Normal)   Pulse 67   Ht 5' 5 (1.651 m)   Wt 170 lb 6 oz (77.3 kg)   SpO2 95%   BMI 28.35 kg/m  , BMI Body mass index is 28.35 kg/m. GEN: Well nourished, well developed, in no acute  distress HEENT: normal Neck: no JVD, carotid bruits, or masses Cardiac: RRR; no murmurs, rubs, or gallops,no edema  Respiratory:  clear to auscultation bilaterally, normal work of breathing GI: soft, nontender, nondistended, + BS MS: no deformity or atrophy Skin: warm and dry, no rash Neuro:  Strength and sensation are intact Psych: euthymic mood, full affect   Recent Labs: No results found for requested labs within last 365 days.    Lipid Panel No results found for: CHOL, HDL, LDLCALC, TRIG    Wt Readings from Last 3 Encounters:  12/30/23 170 lb 6 oz (77.3 kg)  05/13/20 170 lb (77.1 kg)  07/02/19 163 lb 11.2 oz (74.3 kg)     ASSESSMENT AND PLAN:  Problem List Items Addressed This Visit       Cardiology Problems   Essential hypertension - Primary   Relevant Medications  metoprolol  succinate (TOPROL -XL) 100 MG 24 hr tablet   lisinopril (ZESTRIL) 20 MG tablet   hydrochlorothiazide (HYDRODIURIL) 25 MG tablet   Other Relevant Orders   EKG 12-Lead (Completed)   HLD (hyperlipidemia)   Relevant Medications   metoprolol  succinate (TOPROL -XL) 100 MG 24 hr tablet   lisinopril (ZESTRIL) 20 MG tablet   hydrochlorothiazide (HYDRODIURIL) 25 MG tablet     Other   History of palpitations   Relevant Orders   EKG 12-Lead (Completed)   Essential hypertension Numbers well-controlled on current medication regiment Recommended she move metoprolol  succinate to the evening as she is taking all her medications in the morning Primary care to monitor potassium level on hydrochlorothiazide  rec mended she check orthostatics if she develops lightheadedness  Hyperlipidemia Tolerating Lipitor 5 daily Suggested CT calcium  scoring for risk stratification  Lightheadedness Rare fleeting episodes of unclear etiology Recommend she use her Apple Watch to record pulse and EKG when she has episodes She can send these to us  as an attachment through the MyChart system If symptoms get  worse may need a Zio monitor    Signed, Velinda Lunger, M.D., Ph.D. [facility] [place], [place] [phone]

## 2023-12-30 ENCOUNTER — Ambulatory Visit: Attending: Cardiovascular Disease | Admitting: Cardiovascular Disease

## 2023-12-30 ENCOUNTER — Encounter: Payer: Self-pay | Admitting: Cardiovascular Disease

## 2023-12-30 VITALS — BP 122/92 | HR 67 | Ht 65.0 in | Wt 170.4 lb

## 2023-12-30 DIAGNOSIS — E782 Mixed hyperlipidemia: Secondary | ICD-10-CM | POA: Diagnosis not present

## 2023-12-30 DIAGNOSIS — Z87898 Personal history of other specified conditions: Secondary | ICD-10-CM | POA: Diagnosis not present

## 2023-12-30 DIAGNOSIS — I10 Essential (primary) hypertension: Secondary | ICD-10-CM | POA: Insufficient documentation

## 2023-12-30 NOTE — Patient Instructions (Addendum)
 Check viator and getyourguide  Medication Instructions:  No changes  If you need a refill on your cardiac medications before your next appointment, please call your pharmacy.   Lab work: No new labs needed  Testing/Procedures: CT coronary calcium  score.   - $99 out of pocket cost at the time of your test - Call [phone] to schedule at your convenience.  [facility] [address] [place], [place] [address]   Coronary CalciumScan A coronary calcium  scan is an imaging test used to look for deposits of calcium  and other fatty materials (plaques) in the inner lining of the blood vessels of the heart (coronary arteries). These deposits of calcium  and plaques can partly clog and narrow the coronary arteries without producing any symptoms or warning signs. This puts a person at risk for a heart attack. This test can detect these deposits before symptoms develop. Tell a health care provider about: Any allergies you have. All medicines you are taking, including vitamins, herbs, eye drops, creams, and over-the-counter medicines. Any problems you or family members have had with anesthetic medicines. Any blood disorders you have. Any surgeries you have had. Any medical conditions you have. Whether you are pregnant or may be pregnant. What are the risks? Generally, this is a safe procedure. However, problems may occur, including: Harm to a pregnant woman and her unborn baby. This test involves the use of radiation. Radiation exposure can be dangerous to a pregnant woman and her unborn baby. If you are pregnant, you generally should not have this procedure done. Slight increase in the risk of cancer. This is because of the radiation involved in the test. What happens before the procedure? No preparation is needed for this procedure. What happens during the procedure? You will undress and remove any jewelry around your neck or chest. You will put  on a hospital gown. Sticky electrodes will be placed on your chest. The electrodes will be connected to an electrocardiogram (ECG) machine to record a tracing of the electrical activity of your heart. A CT scanner will take pictures of your heart. During this time, you will be asked to lie still and hold your breath for 2-3 seconds while a picture of your heart is being taken. The procedure may vary among health care providers and hospitals. What happens after the procedure? You can get dressed. You can return to your normal activities. It is up to you to get the results of your test. Ask your health care provider, or the department that is doing the test, when your results will be ready. Summary A coronary calcium  scan is an imaging test used to look for deposits of calcium  and other fatty materials (plaques) in the inner lining of the blood vessels of the heart (coronary arteries). Generally, this is a safe procedure. Tell your health care provider if you are pregnant or may be pregnant. No preparation is needed for this procedure. A CT scanner will take pictures of your heart. You can return to your normal activities after the scan is done. This information is not intended to replace advice given to you by your health care provider. Make sure you discuss any questions you have with your health care provider. Document Released: 08/18/2007 Document Revised: 01/09/2016 Document Reviewed: 01/09/2016 Elsevier Interactive Patient Education  2017 [employer].   Follow-Up: At [facility], you and your health needs are our priority.  As part of our continuing mission to provide you with exceptional heart care,  we have created designated Provider Care Teams.  These Care Teams include your primary Cardiologist (physician) and Advanced Practice Providers (APPs -  Physician Assistants and Nurse Practitioners) who all work together to provide you with the care you need, when you need it.  You will  need a follow up appointment as needed  Providers on your designated Care Team:   Lonni Meager, NP Bernardino Bring, PA-C Cadence [place], [place]  COVID-19 Vaccine Information can be found at: [email or web address] For questions related to vaccine distribution or appointments, please email [email or web address] or call [phone].

## 2024-01-06 ENCOUNTER — Ambulatory Visit
Admission: RE | Admit: 2024-01-06 | Discharge: 2024-01-06 | Disposition: A | Discharge status: Home / Self Care | Payer: Self-pay | Source: Ambulatory Visit | Attending: Cardiovascular Disease | Admitting: Cardiovascular Disease

## 2024-01-06 DIAGNOSIS — E782 Mixed hyperlipidemia: Secondary | ICD-10-CM | POA: Insufficient documentation

## 2024-01-07 ENCOUNTER — Other Ambulatory Visit: Payer: Self-pay | Admitting: Emergency Medicine

## 2024-01-07 ENCOUNTER — Ambulatory Visit: Payer: Self-pay | Admitting: Cardiovascular Disease

## 2024-01-07 MED ORDER — ATORVASTATIN CALCIUM 10 MG PO TABS: 10.0000 mg | ORAL_TABLET | Freq: Every evening | ORAL | 3 refills | Status: AC

## 2024-01-07 NOTE — Progress Notes (Signed)
 Orders for prescription sent to patient's preferred pharmacy ([facility] on [address]) for Atorvastatin  10 mg daily.
